# Patient Record
Sex: Male | Born: 1985 | Race: Black or African American | Hispanic: No | Marital: Single | Smoking: Current every day smoker
Health system: Southern US, Community
[De-identification: ages and names within clinical notes are randomized; demographics above are authoritative.]

## PROBLEM LIST (undated history)

## (undated) DIAGNOSIS — Z21 Asymptomatic human immunodeficiency virus [HIV] infection status: Secondary | ICD-10-CM

## (undated) DIAGNOSIS — D849 Immunodeficiency, unspecified: Secondary | ICD-10-CM

## (undated) DIAGNOSIS — B20 Human immunodeficiency virus [HIV] disease: Secondary | ICD-10-CM

---
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
## 2004-08-25 ENCOUNTER — Emergency Department (HOSPITAL_COMMUNITY): Admission: EM | Admit: 2004-08-25 | Discharge: 2004-08-26 | Payer: Self-pay | Admitting: Emergency Medicine

## 2018-05-19 ENCOUNTER — Emergency Department (HOSPITAL_COMMUNITY)
Admission: EM | Admit: 2018-05-19 | Discharge: 2018-05-19 | Disposition: A | Discharge status: Home / Self Care | Payer: Worker's Compensation | Attending: Emergency Medicine | Admitting: Emergency Medicine

## 2018-05-19 ENCOUNTER — Encounter (HOSPITAL_COMMUNITY): Payer: Self-pay | Admitting: Emergency Medicine

## 2018-05-19 DIAGNOSIS — Y990 Civilian activity done for income or pay: Secondary | ICD-10-CM | POA: Diagnosis not present

## 2018-05-19 DIAGNOSIS — Y9389 Activity, other specified: Secondary | ICD-10-CM | POA: Insufficient documentation

## 2018-05-19 DIAGNOSIS — Y9289 Other specified places as the place of occurrence of the external cause: Secondary | ICD-10-CM | POA: Insufficient documentation

## 2018-05-19 DIAGNOSIS — Z23 Encounter for immunization: Secondary | ICD-10-CM | POA: Insufficient documentation

## 2018-05-19 DIAGNOSIS — S6992XA Unspecified injury of left wrist, hand and finger(s), initial encounter: Secondary | ICD-10-CM | POA: Diagnosis present

## 2018-05-19 DIAGNOSIS — Z203 Contact with and (suspected) exposure to rabies: Secondary | ICD-10-CM | POA: Insufficient documentation

## 2018-05-19 DIAGNOSIS — S61233A Puncture wound without foreign body of left middle finger without damage to nail, initial encounter: Secondary | ICD-10-CM | POA: Diagnosis not present

## 2018-05-19 DIAGNOSIS — W540XXA Bitten by dog, initial encounter: Secondary | ICD-10-CM | POA: Diagnosis not present

## 2018-05-19 DIAGNOSIS — Z2914 Encounter for prophylactic rabies immune globin: Secondary | ICD-10-CM | POA: Diagnosis not present

## 2018-05-19 MED ORDER — AMOXICILLIN-POT CLAVULANATE 875-125 MG PO TABS
1.0000 | ORAL_TABLET | Freq: Once | ORAL | Status: AC
  Administered 2018-05-19: 1 via ORAL
  Filled 2018-05-19: qty 1

## 2018-05-19 MED ORDER — RABIES VACCINE, PCEC IM SUSR
1.0000 mL | Freq: Once | INTRAMUSCULAR | Status: AC
  Administered 2018-05-19: 1 mL via INTRAMUSCULAR
  Filled 2018-05-19: qty 1

## 2018-05-19 MED ORDER — AMOXICILLIN-POT CLAVULANATE 875-125 MG PO TABS: 1.0000 | ORAL_TABLET | Freq: Two times a day (BID) | ORAL | 0 refills | Status: AC

## 2018-05-19 MED ORDER — RABIES IMMUNE GLOBULIN 150 UNIT/ML IM INJ
20.0000 [IU]/kg | INJECTION | Freq: Once | INTRAMUSCULAR | Status: AC
  Administered 2018-05-19: 1350 [IU] via INTRAMUSCULAR
  Filled 2018-05-19 (×2): qty 9

## 2018-05-19 MED ORDER — TETANUS-DIPHTH-ACELL PERTUSSIS 5-2.5-18.5 LF-MCG/0.5 IM SUSP
0.5000 mL | Freq: Once | INTRAMUSCULAR | Status: AC
  Administered 2018-05-19: 0.5 mL via INTRAMUSCULAR
  Filled 2018-05-19: qty 0.5

## 2018-05-19 NOTE — ED Triage Notes (Addendum)
Pt bit on left middle finger by dog. Confirmed it did not have rabies vaccine- soap and water scrub. He says hurts and burns but no other symptoms.

## 2018-05-19 NOTE — ED Provider Notes (Signed)
[facility] EMERGENCY DEPARTMENT Provider Note   CSN: [id number] Arrival date & time: 05/19/18  1035     History   Chief Complaint Chief Complaint  Patient presents with  . Animal Bite    HPI Jordan Benjamin is a 32 y.o. male is here for evaluation after dog bite that occurred approximately 1 hour PTA.  Patient works at [employer] and he was picking up a dog that had just been dropped off.  Apparently dog had been found on the streets.  Dog bit him on the left hand causing 2 small puncture wounds to the left middle finger.  Initially there was moderate amount of bleeding but this stopped after pressure.  Patient used water and soap to scrub the wounds.  He has mild pain locally at wounds that worsens with palpation and movement, better with rest.  Animal control has been notified by [employer].  Patient confirms dog was unvaccinated.  [employer] is requiring full rabies immunization prior to return to work.  He is right-hand dominant.  He denies distal paresthesias or loss of sensation.  No significant past medical history.  Unknown tetanus status.  HPI  History reviewed. No pertinent past medical history.  There are no active problems to display for this patient.   History reviewed. No pertinent surgical history.      Home Medications    Prior to Admission medications   Medication Sig Start Date End Date Taking? Authorizing Provider  amoxicillin-clavulanate (AUGMENTIN) 875-125 MG tablet Take 1 tablet by mouth every 12 (twelve) hours for 7 days. 05/19/18 05/26/18  Liberty Handy, PA-C    Family History History reviewed. No pertinent family history.  Social History Social History   Tobacco Use  . Smoking status: Not on file  Substance Use Topics  . Alcohol use: Not on file  . Drug use: Not on file     Allergies   Patient has no known allergies.   Review of Systems Review of Systems  Skin: Positive for wound.  All other  systems reviewed and are negative.    Physical Exam Updated Vital Signs BP 128/81 (BP Location: Left Arm)   Pulse (!) 56   Temp 98 F (36.7 C) (Oral)   Resp 17   Wt 68 kg   SpO2 100%   Physical Exam  Constitutional: He is oriented to person, place, and time. He appears well-developed and well-nourished.  Non-toxic appearance.  HENT:  Head: Normocephalic.  Right Ear: External ear normal.  Left Ear: External ear normal.  Nose: Nose normal.  Eyes: Conjunctivae and EOM are normal.  Neck: Full passive range of motion without pain.  Cardiovascular: Normal rate.  Brisk cap refill distally to middle left finger  Pulmonary/Chest: Effort normal. No tachypnea. No respiratory distress.  Musculoskeletal: Normal range of motion.  No focal bony tenderness or crepitus to left middle finger IP joints.  Full AROM at DIP, PIP and MCP of left middle finger with mild pain.   Neurological: He is alert and oriented to person, place, and time.  Sensation to light touch intact in left distal middle finger tip  Skin: Skin is warm and dry. Capillary refill takes less than 2 seconds.  Tiny abrasion at distal phalanx near DIP joint of left middle finger, smaller abrasion over MCP as well. Minimal erythema and tenderness locally.   Psychiatric: His behavior is normal. Thought content normal.     ED Treatments / Results  Labs (all labs ordered  are listed, but only abnormal results are displayed) Labs Reviewed - No data to display  EKG None  Radiology No results found.  Procedures Procedures (including critical care time)  Medications Ordered in ED Medications  rabies immune globulin (HYPERAB/KEDRAB) injection 1,350 Units (1,350 Units Intramuscular Given 05/19/18 1308)  rabies vaccine (RABAVERT) injection 1 mL (1 mL Intramuscular Given 05/19/18 1309)  Tdap (BOOSTRIX) injection 0.5 mL (0.5 mLs Intramuscular Given 05/19/18 1252)  amoxicillin-clavulanate (AUGMENTIN) 875-125 MG per tablet 1 tablet (1  tablet Oral Given 05/19/18 1251)     Initial Impression / Assessment and Plan / ED Course  I have reviewed the triage vital signs and the nursing notes.  Pertinent labs & imaging results that were available during my care of the patient were reviewed by me and considered in my medical decision making (see chart for details).     32 year old here after dog bite from unvaccinated dog.  He has minimal superficial wounds to the left dorsal middle finger, hemostatic.  Based on exam I doubt there is FB, deeper injury or bony involvement and I do not think x-ray indicated.  Extremity neurovascularly intact.  Wound has been thoroughly cleaned.  Tdap updated.  Rabies series initiated today.  Discussed wound care instructions, patient is aware that the dog bites are high risk for infection.  He is aware of symptoms that would warrant return to the ER.  DC with wound care, NSAIDs, Augmentin.  Final Clinical Impressions(s) / ED Diagnoses   Final diagnoses:  Dog bite, initial encounter  Need for post exposure prophylaxis for rabies    ED Discharge Orders         Ordered    amoxicillin-clavulanate (AUGMENTIN) 875-125 MG tablet  Every 12 hours     05/19/18 1258           Liberty HandyGibbons, Luanne Krzyzanowski J, PA-C 05/19/18 1347    Donnetta Hutchingook, Brian, MD 05/20/18 1213

## 2018-05-19 NOTE — ED Notes (Signed)
ED Provider at bedside. 

## 2018-05-19 NOTE — ED Notes (Signed)
Pt verbalized understanding of discharge instructions and denies any further questions at this time.   

## 2018-05-19 NOTE — ED Notes (Signed)
Animal control has been previously notified prior to patients arrival. Pt has the paperwork at bedside.

## 2018-05-19 NOTE — Discharge Instructions (Addendum)
You were seen in the ER after dog bite from an unvaccinated dog.  Rabies immunization series was started today.    Day 0 05/19/18: RIB and vaccine administered Day 3 05/22/18: vaccine needed Day 7 05/26/18: vaccine needed Day 14 06/02/18: vaccine needed (last dose)   As we discussed, dog bites are very high risk for infection.  Keep your wound clean with clean water and soap, you can apply thin layer of antibiotic ointment at least twice daily.  There may be slight swelling, redness and pain and you can take ibuprofen or acetaminophen for this.  Return to the ER immediately for persistent, worsening redness, warmth, drainage, fevers or spread of symptoms up the hand or arm.  Take augmentin (antibiotic) as prescribed and until completed

## 2018-05-23 ENCOUNTER — Other Ambulatory Visit: Payer: Self-pay

## 2018-05-23 ENCOUNTER — Encounter (HOSPITAL_COMMUNITY): Payer: Self-pay

## 2018-05-23 ENCOUNTER — Emergency Department (HOSPITAL_COMMUNITY)
Admission: EM | Admit: 2018-05-23 | Discharge: 2018-05-23 | Disposition: A | Discharge status: Home / Self Care | Payer: Worker's Compensation | Attending: Emergency Medicine | Admitting: Emergency Medicine

## 2018-05-23 DIAGNOSIS — J069 Acute upper respiratory infection, unspecified: Secondary | ICD-10-CM | POA: Insufficient documentation

## 2018-05-23 DIAGNOSIS — F17210 Nicotine dependence, cigarettes, uncomplicated: Secondary | ICD-10-CM | POA: Insufficient documentation

## 2018-05-23 DIAGNOSIS — Z2914 Encounter for prophylactic rabies immune globin: Secondary | ICD-10-CM | POA: Diagnosis not present

## 2018-05-23 DIAGNOSIS — Z23 Encounter for immunization: Secondary | ICD-10-CM

## 2018-05-23 MED ORDER — FLUTICASONE PROPIONATE 50 MCG/ACT NA SUSP: 1.0000 | Freq: Every day | NASAL | 0 refills | Status: DC

## 2018-05-23 MED ORDER — BENZONATATE 100 MG PO CAPS: 100.0000 mg | ORAL_CAPSULE | Freq: Three times a day (TID) | ORAL | 0 refills | Status: DC

## 2018-05-23 MED ORDER — RABIES VACCINE, PCEC IM SUSR
1.0000 mL | Freq: Once | INTRAMUSCULAR | Status: AC
  Administered 2018-05-23: 1 mL via INTRAMUSCULAR
  Filled 2018-05-23: qty 1

## 2018-05-23 MED ORDER — CETIRIZINE-PSEUDOEPHEDRINE ER 5-120 MG PO TB12: 1.0000 | ORAL_TABLET | Freq: Every day | ORAL | 0 refills | Status: DC

## 2018-05-23 NOTE — Discharge Instructions (Addendum)
Take Tessalon every 8 hours as needed for cough.  Take Flonase once daily as prescribed for nasal congestion.  Take Zyrtec-D once daily as well for nasal congestion.  Please go to urgent care across the street as scheduled for your next rabies vaccination according to your schedule given to you at your last visit.  Please return to emergency department if you develop any new or worsening symptoms including shortness of breath, ongoing productive cough in conjunction with persistent fever over 100.4, or any other concerning symptoms.

## 2018-05-23 NOTE — ED Provider Notes (Signed)
[facility] EMERGENCY DEPARTMENT Provider Note   CSN: [id number] Arrival date & time: 05/23/18  1648     History   Chief Complaint Chief Complaint  Patient presents with  . Rabies Injection    HPI Jordan Benjamin is a 32 y.o. male who presents for next rabies injection in series after dog bite on 05/19/2018.  Patient reports over the past few days he is also had nasal congestion and sore throat.  He reports having a fever of 100 today.  He reports he just started coughing intermittently today.  He denies any chest pain, shortness of breath, abdominal pain, nausea, vomiting, although he did vomit twice after his 7 shots the day he was back.  He has not had any further vomiting.  His wound has been healing well and denies any pain in the area.  He reports he has been around 2 people at work with similar URI symptoms.  HPI  History reviewed. No pertinent past medical history.  There are no active problems to display for this patient.   History reviewed. No pertinent surgical history.      Home Medications    Prior to Admission medications   Medication Sig Start Date End Date Taking? Authorizing Provider  amoxicillin-clavulanate (AUGMENTIN) 875-125 MG tablet Take 1 tablet by mouth every 12 (twelve) hours for 7 days. 05/19/18 05/26/18  Liberty Handy, PA-C  benzonatate (TESSALON) 100 MG capsule Take 1 capsule (100 mg total) by mouth every 8 (eight) hours. 05/23/18   Seferina Brokaw, Waylan Boga, PA-C  cetirizine-pseudoephedrine (ZYRTEC-D) 5-120 MG tablet Take 1 tablet by mouth daily. 05/23/18   Nancy Manuele, Waylan Boga, PA-C  fluticasone (FLONASE) 50 MCG/ACT nasal spray Place 1 spray into both nostrils daily. 05/23/18   Emi Holes, PA-C    Family History History reviewed. No pertinent family history.  Social History Social History   Tobacco Use  . Smoking status: Current Every Day Smoker  . Smokeless tobacco: Never Used  Substance Use Topics  . Alcohol use: Not Currently   . Drug use: Never     Allergies   Patient has no known allergies.   Review of Systems Review of Systems  Constitutional: Positive for fever.  HENT: Positive for congestion and ear pain.   Respiratory: Positive for cough. Negative for shortness of breath.   Cardiovascular: Negative for chest pain.  Skin: Positive for wound.     Physical Exam Updated Vital Signs BP 138/87 (BP Location: Right Arm)   Pulse 84   Temp 99 F (37.2 C) (Oral)   Resp 18   Wt 68 kg   SpO2 100%   Physical Exam  Constitutional: He appears well-developed and well-nourished. No distress.  HENT:  Head: Normocephalic and atraumatic.  Right Ear: Tympanic membrane normal.  Left Ear: Tympanic membrane normal.  Mouth/Throat: Posterior oropharyngeal erythema present. No oropharyngeal exudate, posterior oropharyngeal edema or tonsillar abscesses. Tonsils are 0 on the right. Tonsils are 0 on the left. No tonsillar exudate.  Eyes: Pupils are equal, round, and reactive to light. Conjunctivae are normal. Right eye exhibits no discharge. Left eye exhibits no discharge. No scleral icterus.  Neck: Normal range of motion. Neck supple. No thyromegaly present.  Cardiovascular: Normal rate, regular rhythm, normal heart sounds and intact distal pulses. Exam reveals no gallop and no friction rub.  No murmur heard. Pulmonary/Chest: Effort normal and breath sounds normal. No stridor. No respiratory distress. He has no wheezes. He has no rales.  Abdominal: Soft. Bowel sounds  are normal. He exhibits no distension. There is no tenderness. There is no rebound and no guarding.  Musculoskeletal: He exhibits no edema.  Lymphadenopathy:    He has no cervical adenopathy.  Neurological: He is alert. Coordination normal.  Skin: Skin is warm and dry. No rash noted. He is not diaphoretic. No pallor.  Well-healing, barely visible, puncture wounds to the left long finger  Psychiatric: He has a normal mood and affect.  Nursing note and  vitals reviewed.    ED Treatments / Results  Labs (all labs ordered are listed, but only abnormal results are displayed) Labs Reviewed - No data to display  EKG None  Radiology No results found.  Procedures Procedures (including critical care time)  Medications Ordered in ED Medications  rabies vaccine (RABAVERT) injection 1 mL (1 mL Intramuscular Given 05/23/18 1914)     Initial Impression / Assessment and Plan / ED Course  I have reviewed the triage vital signs and the nursing notes.  Pertinent labs & imaging results that were available during my care of the patient were reviewed by me and considered in my medical decision making (see chart for details).     Patient presenting for rabies vaccination, second in the series.  Vaccination given.  Patient also with URI symptoms.  Lungs are clear patient reports very mild cough but only began today.  Patient is afebrile today.  We will treat supportively with Tessalon, Zyrtec-D, Flonase.  Strict return precautions given.  Patient advised to follow-up at urgent care for next rabies vaccination.  Patient understands and agrees with plan.  Patient vitals stable throughout ED course and discharged in satisfactory condition.  Final Clinical Impressions(s) / ED Diagnoses   Final diagnoses:  Need for rabies vaccination  Upper respiratory tract infection, unspecified type    ED Discharge Orders         Ordered    benzonatate (TESSALON) 100 MG capsule  Every 8 hours     05/23/18 1919    cetirizine-pseudoephedrine (ZYRTEC-D) 5-120 MG tablet  Daily     05/23/18 1919    fluticasone (FLONASE) 50 MCG/ACT nasal spray  Daily     05/23/18 1919           Emi HolesLaw, Sapphire Tygart M, PA-C 05/23/18 1920    Virgina Norfolkuratolo, Adam, DO 05/24/18 0115

## 2018-05-23 NOTE — ED Triage Notes (Signed)
Pt here for second round of rabies injections, first round done on 05/19/18. A&Ox4 no signs of infections on the wound.

## 2018-05-23 NOTE — ED Notes (Signed)
Pt here to receive he next dose of rabies vaccination.

## 2019-08-15 DIAGNOSIS — Z20828 Contact with and (suspected) exposure to other viral communicable diseases: Secondary | ICD-10-CM | POA: Diagnosis not present

## 2019-09-15 ENCOUNTER — Ambulatory Visit (INDEPENDENT_AMBULATORY_CARE_PROVIDER_SITE_OTHER): Payer: BC Managed Care – PPO

## 2019-09-15 ENCOUNTER — Ambulatory Visit (HOSPITAL_COMMUNITY)
Admission: EM | Admit: 2019-09-15 | Discharge: 2019-09-15 | Disposition: A | Discharge status: Home / Self Care | Payer: BC Managed Care – PPO | Attending: Urgent Care | Admitting: Urgent Care

## 2019-09-15 ENCOUNTER — Other Ambulatory Visit: Payer: Self-pay

## 2019-09-15 ENCOUNTER — Encounter (HOSPITAL_COMMUNITY): Payer: Self-pay

## 2019-09-15 DIAGNOSIS — F17200 Nicotine dependence, unspecified, uncomplicated: Secondary | ICD-10-CM | POA: Diagnosis not present

## 2019-09-15 DIAGNOSIS — K529 Noninfective gastroenteritis and colitis, unspecified: Secondary | ICD-10-CM | POA: Insufficient documentation

## 2019-09-15 DIAGNOSIS — R109 Unspecified abdominal pain: Secondary | ICD-10-CM | POA: Diagnosis not present

## 2019-09-15 DIAGNOSIS — Z20822 Contact with and (suspected) exposure to covid-19: Secondary | ICD-10-CM | POA: Insufficient documentation

## 2019-09-15 DIAGNOSIS — Z79899 Other long term (current) drug therapy: Secondary | ICD-10-CM | POA: Diagnosis not present

## 2019-09-15 MED ORDER — ONDANSETRON 4 MG PO TBDP: 4.0000 mg | ORAL_TABLET | Freq: Three times a day (TID) | ORAL | 0 refills | Status: DC | PRN

## 2019-09-15 NOTE — ED Triage Notes (Signed)
Pt present to UC with diarrhea, vomiting and headache x 1 day.

## 2019-09-15 NOTE — Discharge Instructions (Addendum)
Nothing concerning on x-ray today.  This is mostly likely some sort of GI illness. I am sending in Zofran to use as needed for nausea, vomiting.  Make sure you are trying to sip fluids and stay hydrated.  Small bland meals for now.  Advance diet as tolerated.  If your symptoms continue or worsen you will need to follow-up

## 2019-09-17 LAB — NOVEL CORONAVIRUS, NAA (HOSP ORDER, SEND-OUT TO REF LAB; TAT 18-24 HRS): SARS-CoV-2, NAA: NOT DETECTED

## 2019-09-18 NOTE — ED Provider Notes (Signed)
[facility]    CSN: [id number] Arrival date & time: 09/15/19  1014      History   Chief Complaint Chief Complaint  Patient presents with  . Emesis  . Diarrhea    HPI Jordan Benjamin is a 34 y.o. male.   Patient is a 34 year old male who presents today with generalized abdominal discomfort, diarrhea, vomiting headache x1 day.  Vomited 4-5 times.  No blood in emesis.  Mostly vomiting after trying to eat.  Has been sipping fluids.  Symptoms have been constant.  He is also had mild cough, nasal congestion and rhinorrhea.  No fever, chills, body aches or night sweats.  Has has had some mild constipation.  No rectal bleeding or rectal pain.   ROS per HPI    Emesis Associated symptoms: diarrhea   Diarrhea Associated symptoms: vomiting     History reviewed. No pertinent past medical history.  There are no problems to display for this patient.   History reviewed. No pertinent surgical history.     Home Medications    Prior to Admission medications   Medication Sig Start Date End Date Taking? Authorizing Provider  benzonatate (TESSALON) 100 MG capsule Take 1 capsule (100 mg total) by mouth every 8 (eight) hours. 05/23/18   Law, Bea Graff, PA-C  cetirizine-pseudoephedrine (ZYRTEC-D) 5-120 MG tablet Take 1 tablet by mouth daily. 05/23/18   Law, Bea Graff, PA-C  fluticasone (FLONASE) 50 MCG/ACT nasal spray Place 1 spray into both nostrils daily. 05/23/18   Law, Bea Graff, PA-C  ondansetron (ZOFRAN ODT) 4 MG disintegrating tablet Take 1 tablet (4 mg total) by mouth every 8 (eight) hours as needed for nausea or vomiting. 09/15/19   Orvan July, NP    Family History History reviewed. No pertinent family history.  Social History Social History   Tobacco Use  . Smoking status: Current Every Day Smoker  . Smokeless tobacco: Never Used  Substance Use Topics  . Alcohol use: Not Currently  . Drug use: Never     Allergies   Patient has no known  allergies.   Review of Systems Review of Systems  Gastrointestinal: Positive for diarrhea and vomiting.     Physical Exam Triage Vital Signs ED Triage Vitals  Enc Vitals Group     BP 09/15/19 1107 136/80     Pulse Rate 09/15/19 1107 78     Resp 09/15/19 1107 16     Temp 09/15/19 1107 98.8 F (37.1 C)     Temp Source 09/15/19 1107 Oral     SpO2 09/15/19 1107 100 %     Weight --      Height --      Head Circumference --      Peak Flow --      Pain Score 09/15/19 1105 2     Pain Loc --      Pain Edu? --      Excl. in [place]? --    No data found.  Updated Vital Signs BP 136/80 (BP Location: Left Arm)   Pulse 78   Temp 98.8 F (37.1 C) (Oral)   Resp 16   SpO2 100%   Visual Acuity Right Eye Distance:   Left Eye Distance:   Bilateral Distance:    Right Eye Near:   Left Eye Near:    Bilateral Near:     Physical Exam Vitals and nursing note reviewed.  Constitutional:      General: He is not in acute distress.  Appearance: Normal appearance. He is not ill-appearing, toxic-appearing or diaphoretic.  HENT:     Head: Normocephalic and atraumatic.     Nose: Nose normal.     Mouth/Throat:     Pharynx: Oropharynx is clear.  Eyes:     Conjunctiva/sclera: Conjunctivae normal.  Cardiovascular:     Rate and Rhythm: Normal rate and regular rhythm.  Pulmonary:     Effort: Pulmonary effort is normal.     Breath sounds: Normal breath sounds.  Abdominal:     Palpations: Abdomen is soft.     Tenderness: There is generalized abdominal tenderness. There is no guarding or rebound.  Musculoskeletal:        General: Normal range of motion.     Cervical back: Normal range of motion.  Skin:    General: Skin is warm and dry.  Neurological:     Mental Status: He is alert.  Psychiatric:        Mood and Affect: Mood normal.      UC Treatments / Results  Labs (all labs ordered are listed, but only abnormal results are displayed) Labs Reviewed  NOVEL CORONAVIRUS, NAA (HOSP  ORDER, SEND-OUT TO REF LAB; TAT 18-24 HRS)    EKG   Radiology No results found.  Procedures Procedures (including critical care time)  Medications Ordered in UC Medications - No data to display  Initial Impression / Assessment and Plan / UC Course  I have reviewed the triage vital signs and the nursing notes.  Pertinent labs & imaging results that were available during my care of the patient were reviewed by me and considered in my medical decision making (see chart for details).     Gastroenteritis-symptoms most consistent with this. X-ray without any concern for bowel obstruction.  Patient is not constipated. No acute abdomen on exam.  Sending Zofran to use as needed for nausea, vomiting Recommended sip fluids to stay hydrated. Small bland meals for now advance diet as tolerated. Follow up as needed for continued or worsening symptoms Covid test pending.   Final Clinical Impressions(s) / UC Diagnoses   Final diagnoses:  Gastroenteritis     Discharge Instructions     Nothing concerning on x-ray today.  This is mostly likely some sort of GI illness. I am sending in Zofran to use as needed for nausea, vomiting.  Make sure you are trying to sip fluids and stay hydrated.  Small bland meals for now.  Advance diet as tolerated.  If your symptoms continue or worsen you will need to follow-up    ED Prescriptions    Medication Sig Dispense Auth. Provider   ondansetron (ZOFRAN ODT) 4 MG disintegrating tablet Take 1 tablet (4 mg total) by mouth every 8 (eight) hours as needed for nausea or vomiting. 20 tablet Dahlia Byes A, NP     PDMP not reviewed this encounter.   Janace Aris, NP 09/18/19 [phone]

## 2019-09-25 ENCOUNTER — Ambulatory Visit: Payer: BC Managed Care – PPO | Attending: Internal Medicine

## 2019-09-25 DIAGNOSIS — Z20822 Contact with and (suspected) exposure to covid-19: Secondary | ICD-10-CM | POA: Diagnosis not present

## 2019-09-26 LAB — NOVEL CORONAVIRUS, NAA: SARS-CoV-2, NAA: NOT DETECTED

## 2019-09-29 DIAGNOSIS — Z20828 Contact with and (suspected) exposure to other viral communicable diseases: Secondary | ICD-10-CM | POA: Diagnosis not present

## 2020-04-16 DIAGNOSIS — Z20822 Contact with and (suspected) exposure to covid-19: Secondary | ICD-10-CM | POA: Diagnosis not present

## 2020-04-26 ENCOUNTER — Ambulatory Visit: Payer: BC Managed Care – PPO

## 2020-04-26 ENCOUNTER — Other Ambulatory Visit (HOSPITAL_COMMUNITY)
Admission: RE | Admit: 2020-04-26 | Discharge: 2020-04-26 | Disposition: A | Discharge status: Home / Self Care | Payer: BC Managed Care – PPO | Source: Ambulatory Visit | Attending: Infectious Diseases | Admitting: Infectious Diseases

## 2020-04-26 ENCOUNTER — Other Ambulatory Visit: Payer: Self-pay

## 2020-04-26 ENCOUNTER — Ambulatory Visit: Payer: BC Managed Care – PPO | Admitting: *Deleted

## 2020-04-26 ENCOUNTER — Other Ambulatory Visit: Payer: BC Managed Care – PPO

## 2020-04-26 ENCOUNTER — Other Ambulatory Visit: Payer: Self-pay | Admitting: *Deleted

## 2020-04-26 DIAGNOSIS — B20 Human immunodeficiency virus [HIV] disease: Secondary | ICD-10-CM | POA: Diagnosis not present

## 2020-04-26 DIAGNOSIS — Z79899 Other long term (current) drug therapy: Secondary | ICD-10-CM | POA: Diagnosis not present

## 2020-04-26 DIAGNOSIS — Z113 Encounter for screening for infections with a predominantly sexual mode of transmission: Secondary | ICD-10-CM | POA: Insufficient documentation

## 2020-04-26 LAB — T-HELPER CELL (CD4) - (RCID CLINIC ONLY)
CD4 % Helper T Cell: 6 % — ABNORMAL LOW (ref 33–65)
CD4 T Cell Abs: 101 /uL — ABNORMAL LOW (ref 400–1790)

## 2020-04-26 MED ORDER — BIKTARVY 50-200-25 MG PO TABS: 1.0000 | ORAL_TABLET | Freq: Every day | ORAL | 0 refills | Status: DC

## 2020-04-26 NOTE — Progress Notes (Signed)
Met with patient for modified intake. 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 Patient came in for financial counseling, asked to meet with a nurse for questions. RN introduced him to the clinic, answered his questions to his satisfaction.  He is eager to start medication today.  He has no known allergies, takes no medication other than the occasional advil for current shoulder pain (hit his right shoulder on the wall).  RN obtained verbal order for Biktarvy #30 to be sent to his local [facility] pharmacy, gave patient copay card to cover his out of pocket expenses.  RN explained how to take Biktarvy and potential side effects.  Patient will contact [facility] if he has any nausea or other side effect. He will be going out of town in September, but will be back for his new patient appointment with Dr West Bali 9/16.  He asked for assistance in reactivating his MyChart. RN reset his password. He will call if he has any questions or issues. Patient's questions answered to his satisfaction. Landis Gandy, RN

## 2020-04-29 LAB — URINE CYTOLOGY ANCILLARY ONLY
Chlamydia: NEGATIVE
Comment: NEGATIVE
Comment: NORMAL
Neisseria Gonorrhea: NEGATIVE

## 2020-05-07 ENCOUNTER — Other Ambulatory Visit: Payer: Self-pay | Admitting: *Deleted

## 2020-05-07 DIAGNOSIS — B20 Human immunodeficiency virus [HIV] disease: Secondary | ICD-10-CM

## 2020-05-07 MED ORDER — BIKTARVY 50-200-25 MG PO TABS: 1.0000 | ORAL_TABLET | Freq: Every day | ORAL | 0 refills | Status: DC

## 2020-05-11 LAB — COMPLETE METABOLIC PANEL WITH GFR
AG Ratio: 1.2 (calc) (ref 1.0–2.5)
ALT: 20 U/L (ref 9–46)
AST: 22 U/L (ref 10–40)
Albumin: 4.7 g/dL (ref 3.6–5.1)
Alkaline phosphatase (APISO): 70 U/L (ref 36–130)
BUN: 12 mg/dL (ref 7–25)
CO2: 29 mmol/L (ref 20–32)
Calcium: 10 mg/dL (ref 8.6–10.3)
Chloride: 103 mmol/L (ref 98–110)
Creat: 1.13 mg/dL (ref 0.60–1.35)
GFR, Est African American: 97 mL/min/{1.73_m2} (ref >=60)
GFR, Est Non African American: 84 mL/min/{1.73_m2} (ref >=60)
Globulin: 3.8 g/dL (calc) — ABNORMAL HIGH (ref 1.9–3.7)
Glucose, Bld: 66 mg/dL (ref 65–99)
Potassium: 4.5 mmol/L (ref 3.5–5.3)
Sodium: 138 mmol/L (ref 135–146)
Total Bilirubin: 0.4 mg/dL (ref 0.2–1.2)
Total Protein: 8.5 g/dL — ABNORMAL HIGH (ref 6.1–8.1)

## 2020-05-11 LAB — HIV-1/2 AB - DIFFERENTIATION
HIV-1 antibody: POSITIVE — AB
HIV-2 Ab: UNDETERMINED — AB

## 2020-05-11 LAB — QUANTIFERON-TB GOLD PLUS
Mitogen-NIL: 6.94 IU/mL
NIL: 0.07 IU/mL
QuantiFERON-TB Gold Plus: NEGATIVE
TB1-NIL: 0.01 IU/mL
TB2-NIL: 0 IU/mL

## 2020-05-11 LAB — HEPATITIS B SURFACE ANTIGEN: Hepatitis B Surface Ag: NONREACTIVE

## 2020-05-11 LAB — HLA B*5701: HLA-B*5701 w/rflx HLA-B High: NEGATIVE

## 2020-05-11 LAB — URINALYSIS
Bilirubin Urine: NEGATIVE
Glucose, UA: NEGATIVE
Hgb urine dipstick: NEGATIVE
Ketones, ur: NEGATIVE
Leukocytes,Ua: NEGATIVE
Nitrite: NEGATIVE
Protein, ur: NEGATIVE
Specific Gravity, Urine: 1.018 (ref 1.001–1.03)
pH: 6 (ref 5.0–8.0)

## 2020-05-11 LAB — CBC WITH DIFFERENTIAL/PLATELET
Absolute Monocytes: 293 cells/uL (ref 200–950)
Basophils Absolute: 39 cells/uL (ref 0–200)
Basophils Relative: 1 %
Eosinophils Absolute: 90 cells/uL (ref 15–500)
Eosinophils Relative: 2.3 %
HCT: 43.8 % (ref 38.5–50.0)
Hemoglobin: 14.4 g/dL (ref 13.2–17.1)
Lymphs Abs: 1849 cells/uL (ref 850–3900)
MCH: 28 pg (ref 27.0–33.0)
MCHC: 32.9 g/dL (ref 32.0–36.0)
MCV: 85.2 fL (ref 80.0–100.0)
MPV: 10.3 fL (ref 7.5–12.5)
Monocytes Relative: 7.5 %
Neutro Abs: 1630 cells/uL (ref 1500–7800)
Neutrophils Relative %: 41.8 %
Platelets: 212 10*3/uL (ref 140–400)
RBC: 5.14 10*6/uL (ref 4.20–5.80)
RDW: 12.9 % (ref 11.0–15.0)
Total Lymphocyte: 47.4 %
WBC: 3.9 10*3/uL (ref 3.8–10.8)

## 2020-05-11 LAB — LIPID PANEL
Cholesterol: 113 mg/dL (ref <200)
HDL: 48 mg/dL (ref >=40)
LDL Cholesterol (Calc): 42 mg/dL (calc)
Non-HDL Cholesterol (Calc): 65 mg/dL (calc) (ref <130)
Total CHOL/HDL Ratio: 2.4 (calc) (ref <5.0)
Triglycerides: 142 mg/dL (ref <150)

## 2020-05-11 LAB — RPR: RPR Ser Ql: NONREACTIVE

## 2020-05-11 LAB — HIV-1 RNA ULTRAQUANT REFLEX TO GENTYP+
HIV 1 RNA Quant: 318000 copies/mL — ABNORMAL HIGH
HIV-1 RNA Quant, Log: 5.5 Log copies/mL — ABNORMAL HIGH

## 2020-05-11 LAB — HEPATITIS C ANTIBODY
Hepatitis C Ab: NONREACTIVE
SIGNAL TO CUT-OFF: 0.14 (ref <1.00)

## 2020-05-11 LAB — HEPATITIS A ANTIBODY, TOTAL: Hepatitis A AB,Total: NONREACTIVE

## 2020-05-11 LAB — HIV ANTIBODY (ROUTINE TESTING W REFLEX): HIV 1&2 Ab, 4th Generation: REACTIVE — AB

## 2020-05-11 LAB — HEPATITIS B SURFACE ANTIBODY,QUALITATIVE: Hep B S Ab: REACTIVE — AB

## 2020-05-11 LAB — HEPATITIS B CORE ANTIBODY, TOTAL: Hep B Core Total Ab: NONREACTIVE

## 2020-05-11 LAB — HIV-1 GENOTYPE: HIV-1 Genotype: DETECTED — AB

## 2020-05-16 ENCOUNTER — Encounter: Payer: Self-pay | Admitting: Infectious Diseases

## 2020-05-16 ENCOUNTER — Ambulatory Visit (INDEPENDENT_AMBULATORY_CARE_PROVIDER_SITE_OTHER): Payer: BC Managed Care – PPO | Admitting: Pharmacist

## 2020-05-16 ENCOUNTER — Other Ambulatory Visit (HOSPITAL_COMMUNITY)
Admission: RE | Admit: 2020-05-16 | Discharge: 2020-05-16 | Disposition: A | Discharge status: Home / Self Care | Payer: BC Managed Care – PPO | Source: Ambulatory Visit | Attending: Infectious Diseases | Admitting: Infectious Diseases

## 2020-05-16 ENCOUNTER — Other Ambulatory Visit: Payer: Self-pay

## 2020-05-16 ENCOUNTER — Ambulatory Visit (INDEPENDENT_AMBULATORY_CARE_PROVIDER_SITE_OTHER): Payer: BC Managed Care – PPO | Admitting: Infectious Diseases

## 2020-05-16 VITALS — BP 137/84 | HR 66 | Temp 98.6°F | Wt 152.0 lb

## 2020-05-16 DIAGNOSIS — B20 Human immunodeficiency virus [HIV] disease: Secondary | ICD-10-CM | POA: Diagnosis not present

## 2020-05-16 MED ORDER — BICTEGRAVIR-EMTRICITAB-TENOFOV 50-200-25 MG PO TABS: 1.0000 | ORAL_TABLET | Freq: Every day | ORAL | 3 refills | Status: DC

## 2020-05-16 MED ORDER — SULFAMETHOXAZOLE-TRIMETHOPRIM 400-80 MG PO TABS: 1.0000 | ORAL_TABLET | Freq: Every day | ORAL | 3 refills | Status: DC

## 2020-05-16 NOTE — Progress Notes (Signed)
HPI: Bliss Tsang is a 34 y.o. male who presents to the [facility] clinic today to initiate care for a newly diagnosed HIV infection.  There are no problems to display for this patient.   Patient's Medications  New Prescriptions   No medications on file  Previous Medications   BICTEGRAVIR-EMTRICITABINE-TENOFOVIR AF (BIKTARVY) 50-200-25 MG TABS TABLET    Take 1 tablet by mouth daily.   BICTEGRAVIR-EMTRICITABINE-TENOFOVIR AF (BIKTARVY) 50-200-25 MG TABS TABLET    Take 1 tablet by mouth daily.   CETIRIZINE-PSEUDOEPHEDRINE (ZYRTEC-D) 5-120 MG TABLET    Take 1 tablet by mouth daily.   SULFAMETHOXAZOLE-TRIMETHOPRIM (BACTRIM) 400-80 MG TABLET    Take 1 tablet by mouth daily.  Modified Medications   No medications on file  Discontinued Medications   No medications on file    Allergies: No Known Allergies  Past Medical History: No past medical history on file.  Social History: Social History   Socioeconomic History  . Marital status: Single    Spouse name: Not on file  . Number of children: Not on file  . Years of education: Not on file  . Highest education level: Not on file  Occupational History  . Not on file  Tobacco Use  . Smoking status: Current Every Day Smoker  . Smokeless tobacco: Never Used  . Tobacco comment: 1-2 cigarettes a day   Substance and Sexual Activity  . Alcohol use: Yes    Comment: very rarely   . Drug use: Yes    Types: Marijuana    Comment: daily   . Sexual activity: Yes    Birth control/protection: Condom  Other Topics Concern  . Not on file  Social History Narrative  . Not on file   Social Determinants of Health   Financial Resource Strain:   . Difficulty of Paying Living Expenses: Not on file  Food Insecurity:   . Worried About Programme researcher, broadcasting/film/video in the Last Year: Not on file  . Ran Out of Food in the Last Year: Not on file  Transportation Needs:   . Lack of Transportation (Medical): Not on file  . Lack of Transportation (Non-Medical):  Not on file  Physical Activity:   . Days of Exercise per Week: Not on file  . Minutes of Exercise per Session: Not on file  Stress:   . Feeling of Stress : Not on file  Social Connections:   . Frequency of Communication with Friends and Family: Not on file  . Frequency of Social Gatherings with Friends and Family: Not on file  . Attends Religious Services: Not on file  . Active Member of Clubs or Organizations: Not on file  . Attends Banker Meetings: Not on file  . Marital Status: Not on file    Labs: Lab Results  Component Value Date   HIV1RNAQUANT 318,000 (H) 04/26/2020   CD4TABS 101 (L) 04/26/2020    RPR and STI Lab Results  Component Value Date   LABRPR NON-REACTIVE 04/26/2020    STI Results GC CT  04/26/2020 Negative Negative    Hepatitis B Lab Results  Component Value Date   HEPBSAB REACTIVE (A) 04/26/2020   HEPBSAG NON-REACTIVE 04/26/2020   HEPBCAB NON-REACTIVE 04/26/2020   Hepatitis C Lab Results  Component Value Date   HEPCAB NON-REACTIVE 04/26/2020   Hepatitis A Lab Results  Component Value Date   HAV NON-REACTIVE 04/26/2020   Lipids: Lab Results  Component Value Date   CHOL 113 04/26/2020   TRIG 142 04/26/2020  HDL 48 04/26/2020   CHOLHDL 2.4 04/26/2020   LDLCALC 42 04/26/2020    Current HIV Regimen: Treatment naive  Assessment: Patient has been taking Dovato for 2 weeks and will start Biktarvy today.   Explained that Susanne Borders is a one pill once daily medication with or without food and the importance of not missing any doses. Explained resistance and how it develops and why it is so important to take Biktarvy daily and not skip days or doses. Counseled patient to take it around the same time each day. Counseled on what to do if dose is missed, if closer to missed dose take immediately, if closer to next dose then skip and resume normal schedule.   Cautioned on possible side effects the first week or so including nausea,  diarrhea, dizziness, and headaches but that they should resolve after the first couple of weeks. I reviewed patient medications and found no drug interactions. Counseled patient to separate Biktarvy from divalent cations including multivitamins. Discussed with patient to call clinic if he starts a new medication or herbal supplement. I gave the patient my card and told him to call me with any issues/questions/concerns.  Plan: - Start Biktarvy daily  Jaylenn Baiza L. Tiah Heckel, PharmD, BCIDP, AAHIVP, CPP Clinical Pharmacist Practitioner Infectious Diseases Clinical Pharmacist [facility] 05/16/2020, 4:33 PM

## 2020-05-16 NOTE — Progress Notes (Signed)
[facility], [place], [place], [address]                                                                  Phn. [phone]; Fax: [phone]                                                                             Date: 05/16/20  Reason for Visit: HIV Initial Visit Primary Care provider: No PCP   HPI: Jordan Benjamin is a 34 y.o.old male with a recent diagnosis of HIV who is here for the first provider visit to establish care. Patient is a MSM and has been sexually active with males only lately, however was sexually active with male while in school. He was tested for HIV when he went to get the [place] vaccine in April 15, 2020. He says he has not been sexually active for few months now. He has started taking Dovato for 2 weeks and has started taking Biktarvy from today. He has been compliant with the ART. He lives by himself in [facility] and works in the [employer]. Overall he feels well and denies having any comoplaints today.  He smokes 1-2 cigarettes everyday and drinks alcohol socially and smokes marijuana everyday.   ROS: Denies dysphagia, odynophagia, cough, fever, nausea, vomiting, diarrhea, constipation, weight loss, chills, night sweats, recent hospitalizations, rashes, joint complaints, shortness of breath, headaches, chest pain, abdominal pain, dysuri              Current Outpatient Medications on File Prior to Visit  Medication Sig Dispense Refill  . bictegravir-emtricitabine-tenofovir AF (BIKTARVY) 50-200-25 MG TABS tablet Take 1 tablet by mouth daily. 30 tablet 0  . cetirizine-pseudoephedrine (ZYRTEC-D) 5-120 MG tablet Take 1 tablet by mouth daily. 30 tablet 0  . benzonatate (TESSALON) 100 MG capsule Take 1 capsule (100 mg total) by mouth every 8 (eight)  hours. 21 capsule 0  . fluticasone (FLONASE) 50 MCG/ACT nasal spray Place 1 spray into both nostrils daily. 5 g 0  . ondansetron (ZOFRAN ODT) 4 MG disintegrating tablet Take 1 tablet (4 mg total) by mouth every 8 (eight) hours as needed for nausea or vomiting. 20 tablet 0   No current facility-administered medications on file prior to visit.                            No Known Allergies  PMH: None  Social History   Socioeconomic History  . Marital status: Single    Spouse name: Not on file  . Number of children: Not on file  .  Years of education: Not on file  . Highest education level: Not on file  Occupational History  . Not on file  Tobacco Use  . Smoking status: Current Every Day Smoker  . Smokeless tobacco: Never Used  . Tobacco comment: 1-2 cigarettes a day   Substance and Sexual Activity  . Alcohol use: Yes    Comment: very rarely   . Drug use: Yes    Types: Marijuana    Comment: daily   . Sexual activity: Yes    Birth control/protection: Condom  Other Topics Concern  . Not on file  Social History Narrative  . Not on file   Social Determinants of Health   Financial Resource Strain:   . Difficulty of Paying Living Expenses: Not on file  Food Insecurity:   . Worried About Charity fundraiser in the Last Year: Not on file  . Ran Out of Food in the Last Year: Not on file  Transportation Needs:   . Lack of Transportation (Medical): Not on file  . Lack of Transportation (Non-Medical): Not on file  Physical Activity:   . Days of Exercise per Week: Not on file  . Minutes of Exercise per Session: Not on file  Stress:   . Feeling of Stress : Not on file  Social Connections:   . Frequency of Communication with Friends and Family: Not on file  . Frequency of Social Gatherings with Friends and Family: Not on file  . Attends Religious Services: Not on file  . Active Member of Clubs or Organizations: Not on file  . Attends Archivist Meetings: Not on file    . Marital Status: Not on file  Intimate Partner Violence:   . Fear of Current or Ex-Partner: Not on file  . Emotionally Abused: Not on file  . Physically Abused: Not on file  . Sexually Abused: Not on file    VITALs  BP 137/84   Pulse 66   Temp 98.6 F (37 C) (Oral)   Wt 152 lb (68.9 kg)   SpO2 99%    Examination  Gen: Alert and oriented x 3, no acute distress HEENT: [place]/AT, PERL, no scleral icterus, no pale conjunctivae, hearing normal, oral mucosa moist, NO THRUSH  Neck: Supple, no lymphadenopathy Cardio: Regular rate and rhythm; +S1 and S2; no murmurs, gallops, or rubs Resp: CTAB; no wheezes, rhonchi, or rales GI: Soft, nontender, nondistended, bowel sounds present Extremities: No cyanosis, clubbing, or edema; +2 PT and DP pulses Skin: No rashes, lesions, or ecchymoses Neuro: No focal deficits; CNs II-XII intact; sensation intact; +5/5 MMS b/l UEs and LEs Psych: Calm, cooperative  Lab Results HIV 1 RNA Quant (copies/mL)  Date Value  04/26/2020 318,000 (H)   CD4 T Cell Abs (/uL)  Date Value  04/26/2020 101 (L)   No results found for: HIV1GENOSEQ Lab Results  Component Value Date   WBC 3.9 04/26/2020   HGB 14.4 04/26/2020   HCT 43.8 04/26/2020   MCV 85.2 04/26/2020   PLT 212 04/26/2020    Lab Results  Component Value Date   CREATININE 1.13 04/26/2020   BUN 12 04/26/2020   NA 138 04/26/2020   K 4.5 04/26/2020   CL 103 04/26/2020   CO2 29 04/26/2020   Lab Results  Component Value Date   ALT 20 04/26/2020   AST 22 04/26/2020   BILITOT 0.4 04/26/2020    Lab Results  Component Value Date   CHOL 113 04/26/2020   TRIG 142 04/26/2020  HDL 48 04/26/2020   LDLCALC 42 04/26/2020   Lab Results  Component Value Date   HAV NON-REACTIVE 04/26/2020   Lab Results  Component Value Date   HEPBSAG NON-REACTIVE 04/26/2020   HEPBSAB REACTIVE (A) 04/26/2020   No results found for: HCVAB Lab Results  Component Value Date   CHLAMYDIAWP Negative 04/26/2020    N Negative 04/26/2020   No results found for: GCPROBEAPT No results found for: QUANTGOLD   Syphilis Qunatiferon Vitamin D OI ppx if indicated HLA [id number] HPV Vacc [place] Anal Pap if indicated UA ( 6 monthly if on TDF) Dental    Health Maintenance: Immunization History  Administered Date(s) Administered  . Rabies, IM 05/19/2018, 05/23/2018  . Tdap 05/19/2018    Assessment/Plan: HIV, Treatment Naive Continue Biktarvy 1 tab PO daily Discussed pathogenesis, modes of transmission, modes of prevention and options of treatment  Follow up in 4 weeks   Smoking/Alcohol/Substance use  Counseled  STD Screening  Oral and Anal GC today   Immunization Has been vaccinated with 2 doses of Pfizer Refused Flu vaccine   I spent greater than 60 minutes with the patient including greater than 50% of time in face to face counsel of the patient and in coordination of their care.    Patient's labs were reviewed as well as his previous records. Patients questions were addressed and answered. Safe sex counseling done.   Rosiland Oz, MD Infectious Diseases  Office phone [phone] Fax no. [phone]

## 2020-05-16 NOTE — Patient Instructions (Signed)
What is HIV?--HIV is the name of a virus that can affect the body's "immune system," which is responsible for fighting infections. When people have untreated HIV infection, they can become sick easily. That's because their immune system cannot work as well to fight off infections or cancer. Even so, people with HIV can take medicines to control the virus, keep their immune system strong, and stay healthy for many years. People can get infected with HIV if blood or body fluid (such as semen or vaginal fluids) from a person with HIV enters their body. For example, a person can get HIV if he or she: ?Has sex without using a condom with someone who has HIV - This includes vaginal, anal, and oral sex. ?Shares needles or syringes with someone who has HIV What is AIDS?--AIDS is the term doctors use to describe the stage of HIV infection when the immune system is at its weakest. What are the symptoms of HIV?--When people first get infected with HIV, they can have a fever, sore throat, headache, muscle pain, and joint pain. These symptoms usually last about 2 weeks. In many cases, these symptoms are very mild. Most people with HIV infection do not even remember having them. In the first few years after infection, most people with HIV infection have no symptoms or only mild symptoms. Some people have swelling of small bean-shaped organs under the skin called lymph nodes, usually in the neck, armpit, or groin. This symptom can also happen in people who have HIV for a long time. People who have had untreated HIV for many years might have other problems, such as: ?Fever, stomach pain, nausea, vomiting, diarrhea, and weight loss ?Other infections, including: .Lung infections .Brain infections .Eye infections that cause trouble seeing .Yeast infections in the mouth that can cause soreness and raised, white patches Is there a test for HIV?--Yes. You can have HIV testing done in your doctor's office or clinic  using a sample of blood or sometimes saliva (spit). Results from some tests take a few days to come back. But results from rapid HIV tests can be ready within minutes. Most pharmacies also sell test kits that you can use at home. For one type, you use a special strip to collect a tiny bit of your blood, and mail the strip to a lab for testing. Another type of kit comes with a test strip that you wipe along your gums. If you take a home test that says you are HIV positive, see your doctor and ask for a follow-up test to make sure. How is HIV treated?--Doctors can prescribe different combinations of medicines to treat HIV. These are called "antiretroviral medicines." They work very well to keep HIV infection controlled in most people. You and your doctor should work together to decide when you should start treatment and which medicines are right for you. It is important to follow all of your doctor's instructions about treatment and take your HIV medicines every day. This is because your HIV can get worse if you skip or stop taking your medicines. Let your doctor know if you have any side effects or problems with your medicines. Some people with HIV also take other types of medicines every day to prevent HIV-related infections. For example, most doctors recommend that people with a low "T-cell count" take an antibiotic every day to keep from getting a lung infection called PCP. (T cells are a special type of white blood cell.) What if I am pregnant or want to   get pregnant?--If you have untreated HIV, your baby can become infected with HIV during pregnancy, birth, or through breastfeeding. If you are pregnant or want to get pregnant, talk with your doctor about ways to reduce the chance of passing HIV on to your baby. What can I do to prevent spreading HIV to other people?--To reduce the chance of spreading HIV to other people: ?Get tested for HIV and start treatment as early as possible ?Tell anyone you  plan to have sex with that you have HIV ?Use a condom every time you have vaginal, anal, or oral sex ?Do not share razors or toothbrushes with others ?Do not share drug needles or syringes with others  

## 2020-05-17 ENCOUNTER — Encounter: Payer: Self-pay | Admitting: *Deleted

## 2020-05-17 ENCOUNTER — Other Ambulatory Visit: Payer: Self-pay | Admitting: *Deleted

## 2020-05-17 DIAGNOSIS — B20 Human immunodeficiency virus [HIV] disease: Secondary | ICD-10-CM

## 2020-05-17 LAB — CYTOLOGY, (ORAL, ANAL, URETHRAL) ANCILLARY ONLY
Chlamydia: NEGATIVE
Chlamydia: NEGATIVE
Comment: NEGATIVE
Comment: NEGATIVE
Comment: NORMAL
Comment: NORMAL
Neisseria Gonorrhea: NEGATIVE
Neisseria Gonorrhea: NEGATIVE

## 2020-05-17 MED ORDER — SULFAMETHOXAZOLE-TRIMETHOPRIM 400-80 MG PO TABS: 1.0000 | ORAL_TABLET | Freq: Every day | ORAL | 3 refills | Status: DC

## 2020-05-17 NOTE — Progress Notes (Signed)
Sent bactrim prescription to local pharmacy. Andree Coss, RN

## 2020-06-12 ENCOUNTER — Encounter: Payer: Self-pay | Admitting: Infectious Diseases

## 2020-06-12 ENCOUNTER — Other Ambulatory Visit: Payer: Self-pay

## 2020-06-12 ENCOUNTER — Telehealth (INDEPENDENT_AMBULATORY_CARE_PROVIDER_SITE_OTHER): Payer: BC Managed Care – PPO | Admitting: Infectious Diseases

## 2020-06-12 DIAGNOSIS — B20 Human immunodeficiency virus [HIV] disease: Secondary | ICD-10-CM

## 2020-06-12 MED ORDER — SULFAMETHOXAZOLE-TRIMETHOPRIM 400-80 MG PO TABS: 1.0000 | ORAL_TABLET | Freq: Every day | ORAL | 2 refills | Status: DC

## 2020-06-12 MED ORDER — BIKTARVY 50-200-25 MG PO TABS: 1.0000 | ORAL_TABLET | Freq: Every day | ORAL | 2 refills | Status: DC

## 2020-06-12 NOTE — Progress Notes (Signed)
Virtual Visit via Telephone Note  I connected with@ on 06/12/20 at  9:15 AM EDT by a telephone enabled telemedicine application and verified that I am speaking with the correct person using two identifiers.  Location: Patient: Jordan Benjamin  Provider: Odette Fraction Location: [facility]    I discussed the limitations of evaluation and management by telemedicine and the availability of in person appointments. The patient expressed understanding and agreed to proceed.  [facility]   Patient's Medications  New Prescriptions   No medications on file  Previous Medications   BICTEGRAVIR-EMTRICITABINE-TENOFOVIR AF (BIKTARVY) 50-200-25 MG TABS TABLET    Take 1 tablet by mouth daily.   CETIRIZINE-PSEUDOEPHEDRINE (ZYRTEC-D) 5-120 MG TABLET    Take 1 tablet by mouth daily.  Modified Medications   Modified Medication Previous Medication   BICTEGRAVIR-EMTRICITABINE-TENOFOVIR AF (BIKTARVY) 50-200-25 MG TABS TABLET bictegravir-emtricitabine-tenofovir AF (BIKTARVY) 50-200-25 MG TABS tablet      Take 1 tablet by mouth daily.    Take 1 tablet by mouth daily.   SULFAMETHOXAZOLE-TRIMETHOPRIM (BACTRIM) 400-80 MG TABLET sulfamethoxazole-trimethoprim (BACTRIM) 400-80 MG tablet      Take 1 tablet by mouth daily.    Take 1 tablet by mouth daily.  Discontinued Medications   No medications on file   Subjective Jordan Benjamin is a 34 y.o.old male MSM with a PMH of HIVAIDS on Biktarvy who is here for regular follow up. Last VL was  3,18,000 copies/ml and CD4 101 IN 04/26/2020. He says he has been taking Biktarvy every single day in the morning. However, has missed 2 doses in total. Denies any side effect with the meds or barrier to adherence of treatment. He has been taking Bactrim daily as well. He was supposed to have an in person visit today but it was converted to phone visit by front desk while screening for COVID questionnaire. He denies being sexually active since diagnosis. He smokes  cigarettes and marijuana and denies using ant illicit drugs. He overall feels well and denies any complaints to me. He is going to come to the office tomorrow for lab work   ROS - 10 point ROS negative except as stated above   No past medical history on file.  Social History   Tobacco Use  . Smoking status: Current Every Day Smoker  . Smokeless tobacco: Never Used  . Tobacco comment: 1-2 cigarettes a day   Substance Use Topics  . Alcohol use: Yes    Comment: very rarely   . Drug use: Yes    Types: Marijuana    Comment: daily     No family history on file.  No Known Allergies  Health Maintenance  Topic Date Due  . COVID-19 Vaccine (1) Never done  . INFLUENZA VACCINE  Never done  . TETANUS/TDAP  05/19/2028  . Hepatitis C Screening  Completed  . 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 The patient was provided an opportunity to ask questions and all were answered. The patient agreed with the plan and demonstrated an understanding of the instructions.   The patient was  advised to call back or seek an in-person evaluation if the symptoms worsen or if the condition fails to improve as anticipated.  I provided 20 minutes of non-face-to-face time during this encounter.  Victoriano Lain, MD [facility] Phone [phone] Fax no. [phone]  06/12/2020, 9:19 AM

## 2020-06-12 NOTE — Patient Instructions (Signed)
Please have a blood drawn for HIV RNA as soon as possible depending upon your convenience. Continue taking meds daily Fu in 4 weeks

## 2020-06-13 ENCOUNTER — Other Ambulatory Visit: Payer: BC Managed Care – PPO

## 2020-07-22 ENCOUNTER — Encounter: Payer: Self-pay | Admitting: Infectious Diseases

## 2020-07-22 ENCOUNTER — Other Ambulatory Visit: Payer: Self-pay | Admitting: *Deleted

## 2020-07-22 ENCOUNTER — Other Ambulatory Visit: Payer: Self-pay

## 2020-07-22 ENCOUNTER — Ambulatory Visit (INDEPENDENT_AMBULATORY_CARE_PROVIDER_SITE_OTHER): Payer: BC Managed Care – PPO | Admitting: Infectious Diseases

## 2020-07-22 ENCOUNTER — Other Ambulatory Visit (HOSPITAL_COMMUNITY)
Admission: RE | Admit: 2020-07-22 | Discharge: 2020-07-22 | Disposition: A | Discharge status: Home / Self Care | Payer: BC Managed Care – PPO | Source: Ambulatory Visit | Attending: Infectious Diseases | Admitting: Infectious Diseases

## 2020-07-22 VITALS — BP 142/83 | HR 64 | Wt 156.6 lb

## 2020-07-22 DIAGNOSIS — B20 Human immunodeficiency virus [HIV] disease: Secondary | ICD-10-CM | POA: Diagnosis not present

## 2020-07-22 DIAGNOSIS — Z23 Encounter for immunization: Secondary | ICD-10-CM | POA: Insufficient documentation

## 2020-07-22 MED ORDER — BICTEGRAVIR-EMTRICITAB-TENOFOV 50-200-25 MG PO TABS: 1.0000 | ORAL_TABLET | Freq: Every day | ORAL | 3 refills | Status: DC

## 2020-07-22 MED ORDER — SULFAMETHOXAZOLE-TRIMETHOPRIM 400-80 MG PO TABS: 1.0000 | ORAL_TABLET | Freq: Every day | ORAL | 5 refills | Status: DC

## 2020-07-22 MED ORDER — BICTEGRAVIR-EMTRICITAB-TENOFOV 50-200-25 MG PO TABS: 1.0000 | ORAL_TABLET | Freq: Every day | ORAL | 5 refills | Status: DC

## 2020-07-22 MED ORDER — SULFAMETHOXAZOLE-TRIMETHOPRIM 400-80 MG PO TABS: 1.0000 | ORAL_TABLET | Freq: Every day | ORAL | 3 refills | Status: DC

## 2020-07-22 MED ORDER — BICTEGRAVIR-EMTRICITAB-TENOFOV 50-200-25 MG PO TABS
1.0000 | ORAL_TABLET | Freq: Every day | ORAL | 3 refills | Status: DC
Start: 2020-07-22 — End: 2020-07-22

## 2020-07-22 NOTE — Progress Notes (Addendum)
Jordan Benjamin [facility]                                                                                                                                                                                                       [address], [place], [place], [address]                                                                  Phn. [phone]; Fax: [phone]                                                                             Date: 11/22//21  Reason for Visit: HIV Initial Visit Primary Care provider: No PCP   HPI: Jordan Benjamin is a 34 y.o.old male with a PMH of HIV who is here for HIV follow up. Patient is a MSM. He was tested for HIV when he went to get Jordan Benjamin COVID vaccine in April 15, 2020. He initially  started taking Dovato for 2 weeks followed by [facility] from 06/12/20. Jordan Benjamin Kitchen   Patient says he is taking Biktarvy every day without missing any doses. Denies any associated side effects. He has also been taking Bactrim every single day without any missing doses although he has not taken bactrim for last 2 weeks due to difficulty in obtaining it. Denies having sexual activity for a long time. Smokes 1-2 cigarettes a day, alcohol is " once in a blue moon", smokes marijuana and denies using any drugs. 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 He is following up with dental clinic here for dental pain. Denies having fever, chills. No recent illness or hospitalisation. Overall doing well and no complaints.   ROS: Denies dysphagia, odynophagia, cough, fever, nausea, vomiting, diarrhea, constipation, weight loss, chills, night sweats, recent hospitalizations, rashes, joint complaints, shortness of breath, headaches, chest pain, abdominal pain, dysuri  Meds - Biktarvy, Bactrim and Cetrizine                  No Known Allergies  PMH:  None  Social History   Socioeconomic History  . Marital status: Single    Spouse name: Not on file  . Number of children: Not on file  .  Years of education: Not on file  . Highest education level: Not on file  Occupational History  . Not on file  Tobacco Use  . Smoking status: Current Every Day Smoker  . Smokeless tobacco: Never Used  . Tobacco comment: 1-2 cigarettes a day   Substance and Sexual Activity  . Alcohol use: Yes    Comment: very rarely   . Drug use: Yes    Types: Marijuana    Comment: daily   . Sexual activity: Not Currently    Partners: Male    Birth control/protection: Condom  Other Topics Concern  . Not on file  Social History Narrative  . Not on file   Social Determinants of Health   Financial Resource Strain:   . Difficulty of Paying Living Expenses: Not on file  Food Insecurity:   . Worried About Programme researcher, broadcasting/film/video in the Last Year: Not on file  . Ran Out of Food in the Last Year: Not on file  Transportation Needs:   . Lack of Transportation (Medical): Not on file  . Lack of Transportation (Non-Medical): Not on file  Physical Activity:   . Days of Exercise per Week: Not on file  . Minutes of Exercise per Session: Not on file  Stress:   . Feeling of Stress : Not on file  Social Connections:   . Frequency of Communication with Friends and Family: Not on file  . Frequency of Social Gatherings with Friends and Family: Not on file  . Attends Religious Services: Not on file  . Active Member of Clubs or Organizations: Not on file  . Attends Banker Meetings: Not on file  . Marital Status: Not on file  Intimate Partner Violence:   . Fear of Current or Ex-Partner: Not on file  . Emotionally Abused: Not on file  . Physically Abused: Not on file  . Sexually Abused: Not on file   VITALs  BP (!) 142/83   Pulse 64   Wt 156 lb 9.6 oz (71 kg)    Examination  Gen: Alert and oriented x 3, no acute distress HEENT: [place]/AT, PERL, no scleral icterus, no pale conjunctivae, hearing normal, oral mucosa moist, NO THRUSH,DENTAL CARIES+ Neck: Supple, no lymphadenopathy Cardio: Regular  rate and rhythm; +S1 and S2; no murmurs, gallops, or rubs Resp: CTAB; no wheezes, rhonchi, or rales GI: Soft, nontender, nondistended, bowel sounds present Extremities: No cyanosis, clubbing, or edema; +2 PT and DP pulses Skin: No rashes, lesions, or ecchymoses Neuro: No focal deficits Psych: Calm, cooperative  Lab Results HIV 1 RNA Quant (copies/mL)  Date Value  04/26/2020 318,000 (H)   CD4 T Cell Abs (/uL)  Date Value  04/26/2020 101 (L)   No results found for: HIV1GENOSEQ Lab Results  Component Value Date   WBC 3.9 04/26/2020   HGB 14.4 04/26/2020   HCT 43.8 04/26/2020   MCV 85.2 04/26/2020   PLT 212 04/26/2020    Lab Results  Component Value Date   CREATININE 1.13 04/26/2020   BUN 12 04/26/2020   NA 138 04/26/2020   K 4.5 04/26/2020   CL 103 04/26/2020   CO2 29 04/26/2020   Lab Results  Component Value Date   ALT 20 04/26/2020   AST 22 04/26/2020   BILITOT 0.4 04/26/2020  Lab Results  Component Value Date   CHOL 113 04/26/2020   TRIG 142 04/26/2020   HDL 48 04/26/2020   LDLCALC 42 04/26/2020   Lab Results  Component Value Date   HAV NON-REACTIVE 04/26/2020   Lab Results  Component Value Date   HEPBSAG NON-REACTIVE 04/26/2020   HEPBSAB REACTIVE (A) 04/26/2020   No results found for: HCVAB Lab Results  Component Value Date   CHLAMYDIAWP Negative 05/16/2020   CHLAMYDIAWP Negative 05/16/2020   N Negative 05/16/2020   N Negative 05/16/2020   No results found for: GCPROBEAPT No results found for: QUANTGOLD   Health Maintenance: Immunization History  Administered Date(s) Administered  . Rabies, IM 05/19/2018, 05/23/2018  . Tdap 05/19/2018    Assessment/Plan: AIDS Continue Biktarvy 1 tab PO daily Continue Bactrim 1 SS tab PO daily - Jordan Benjamin has got in touch pharmacy and says they are ready to be delivered. He will call us if he has any issues Follow up in 4 weeks for repeat HIV Viral RNA   Smoking Counseled  STD Screening  Refused as  he was recently tested a month ago  Immunization Has been vaccinated with 2 doses of Pfizer Refused other vaccine until his CD4 count is higher   I spent greater than 20  minutes with the patient including greater than 50% of time in face to face counsel of the patient and in coordination of their care.  Patient's labs were reviewed as well as his previous records. Patients questions were addressed and answered. Safe sex counseling done.   Odette Fraction, MD Infectious Diseases  Office phone [phone] Fax no. [phone]

## 2020-07-22 NOTE — Assessment & Plan Note (Signed)
Continue Biktarvy and Bactrim Fu in 4 weeks

## 2020-07-22 NOTE — Assessment & Plan Note (Signed)
Wants to hold off on recommended vaccines until CD4 count is higher which is OK

## 2020-07-23 LAB — T-HELPER CELL (CD4) - (RCID CLINIC ONLY)
CD4 % Helper T Cell: 8 % — ABNORMAL LOW (ref 33–65)
CD4 T Cell Abs: 176 /uL — ABNORMAL LOW (ref 400–1790)

## 2020-07-23 LAB — URINE CYTOLOGY ANCILLARY ONLY
Chlamydia: NEGATIVE
Comment: NEGATIVE
Comment: NORMAL
Neisseria Gonorrhea: NEGATIVE

## 2020-07-28 LAB — HIV-1 RNA ULTRAQUANT REFLEX TO GENTYP+
HIV 1 RNA Quant: 88 copies/mL — ABNORMAL HIGH
HIV-1 RNA Quant, Log: 1.94 Log copies/mL — ABNORMAL HIGH

## 2020-07-28 LAB — RPR: RPR Ser Ql: NONREACTIVE

## 2020-07-29 ENCOUNTER — Telehealth: Payer: Self-pay

## 2020-07-29 NOTE — Telephone Encounter (Signed)
-----   Message from Odette Fraction, MD sent at 07/29/2020  7:41 AM EST ----- Viral load has come down significantly to 88. Will check repeat Viral RNA in next visit  Please inform patient of the results.

## 2020-07-29 NOTE — Telephone Encounter (Signed)
Spoke with the patient to relay that his viral load has come down to 88 per Dr. Elinor Parkinson. Informed patient that we will recheck labs at his next appointment. RN encouraged patient to keep up the good work in taking his medication. Patient appreciative of call and verbalized understanding.   Sandie Ano, RN

## 2020-08-07 ENCOUNTER — Encounter: Payer: Self-pay | Admitting: Infectious Diseases

## 2020-08-15 ENCOUNTER — Ambulatory Visit: Payer: BC Managed Care – PPO | Admitting: Infectious Diseases

## 2020-08-16 ENCOUNTER — Ambulatory Visit (INDEPENDENT_AMBULATORY_CARE_PROVIDER_SITE_OTHER): Payer: BC Managed Care – PPO | Admitting: Infectious Diseases

## 2020-08-16 ENCOUNTER — Encounter: Payer: Self-pay | Admitting: Infectious Diseases

## 2020-08-16 ENCOUNTER — Other Ambulatory Visit: Payer: Self-pay

## 2020-08-16 VITALS — BP 120/75 | HR 78 | Temp 97.9°F | Resp 16 | Ht 73.0 in | Wt 156.0 lb

## 2020-08-16 DIAGNOSIS — Z23 Encounter for immunization: Secondary | ICD-10-CM

## 2020-08-16 DIAGNOSIS — B20 Human immunodeficiency virus [HIV] disease: Secondary | ICD-10-CM

## 2020-08-16 MED ORDER — SULFAMETHOXAZOLE-TRIMETHOPRIM 400-80 MG PO TABS: 1.0000 | ORAL_TABLET | Freq: Every day | ORAL | 3 refills | Status: DC

## 2020-08-16 MED ORDER — BICTEGRAVIR-EMTRICITAB-TENOFOV 50-200-25 MG PO TABS: 1.0000 | ORAL_TABLET | Freq: Every day | ORAL | 3 refills | Status: DC

## 2020-08-16 NOTE — Progress Notes (Signed)
[address], [place], [place], [address]                                                                  Phn. [phone]; Fax: [phone]                                                                             Date: 08/16/2020   Reason for Visit: HIV follow up    HPI: Jordan Benjamin is a 34 y.o.old male  ( MSM) with a history of HIV who is here for a follow up. He is taking Biktarvy and Bactrim every single day. Denies missing any doses. Denies any side effects related to it. Denies any barrriers to adherence of treatment. He is smoking cigarettes on and off whenever he get the " hit". Alcohol occasionally in the weekend and smokes marijuana everyday. He is planning to celebrate Christmas with his mother and sisters in [place]. He is working in [employer] and is happy working. He wants to get Hep A vaccine and PCV 13 vaccine. He is scheduled to see Dental in January 2021.  ROS: Denies dysphagia, odynophagia, cough, fever, nausea, vomiting, diarrhea, constipation, weight loss, chills, night sweats, recent hospitalizations, rashes, joint complaints, shortness of breath, headaches, chest pain, abdominal pain, dysuria .  Current Outpatient Medications on File Prior to Visit  Medication Sig Dispense Refill  . cetirizine-pseudoephedrine (ZYRTEC-D) 5-120 MG tablet Take 1 tablet by mouth daily. 30 tablet 0   No current facility-administered medications on file prior to visit.    No Known Allergies  Social History   Socioeconomic History  . Marital status: Single    Spouse name: Not on file  . Number of children: Not on file  . Years of education: Not on file  . Highest education level: Not on file  Occupational History  . Not on file  Tobacco Use  . Smoking status: Current  Every Day Smoker  . Smokeless tobacco: Never Used  . Tobacco comment: 1-2 cigarettes a day   Substance and Sexual Activity  . Alcohol use: Yes    Comment: very rarely   . Drug use: Yes    Types: Marijuana    Comment: daily   . Sexual activity: Not Currently    Partners: Male    Birth control/protection: Condom  Other Topics Concern  . Not on file  Social History Narrative  . Not on file   Social Determinants of Health   Financial Resource Strain: Not on file  Food Insecurity: Not on file  Transportation Needs: Not on file  Physical Activity: Not on file  Stress:  Not on file  Social Connections: Not on file  Intimate Partner Violence: Not on file    Vitals  BP 120/75   Pulse 78   Temp 97.9 F (36.6 C) (Oral)   Resp 16   Ht 6\' 1"  (1.854 m)   Wt 156 lb (70.8 kg)   SpO2 98%   BMI 20.58 kg/m    Examination  Gen: Alert and oriented x 3, no acute distress HEENT: [place]/AT, PERL, no scleral icterus, no pale conjunctivae, hearing normal, oral mucosa moist, DENTAL CAVITIES +, NO ORAL CANDIDIASIS  Neck: Supple, no lymphadenopathy Cardio: Regular rate and rhythm; +S1 and S2; no murmurs, gallops, or rubs Resp: CTAB; no wheezes, rhonchi, or rales GI: Soft, nontender, nondistended, bowel sounds present GU: Musc: Extremities: No cyanosis, clubbing, or edema; +2 PT and DP pulses Skin: No rashes, lesions, or ecchymoses Neuro: No focal deficits Psych: Calm, cooperative  Lab Results HIV 1 RNA Quant (copies/mL)  Date Value  07/22/2020 88 (H)  04/26/2020 318,000 (H)   CD4 T Cell Abs (/uL)  Date Value  07/22/2020 176 (L)  04/26/2020 101 (L)   No results found for: HIV1GENOSEQ Lab Results  Component Value Date   WBC 3.9 04/26/2020   HGB 14.4 04/26/2020   HCT 43.8 04/26/2020   MCV 85.2 04/26/2020   PLT 212 04/26/2020    Lab Results  Component Value Date   CREATININE 1.13 04/26/2020   BUN 12 04/26/2020   NA 138 04/26/2020   K 4.5 04/26/2020   CL 103 04/26/2020    CO2 29 04/26/2020   Lab Results  Component Value Date   ALT 20 04/26/2020   AST 22 04/26/2020   BILITOT 0.4 04/26/2020    Lab Results  Component Value Date   CHOL 113 04/26/2020   TRIG 142 04/26/2020   HDL 48 04/26/2020   LDLCALC 42 04/26/2020   Lab Results  Component Value Date   HAV NON-REACTIVE 04/26/2020   Lab Results  Component Value Date   HEPBSAG NON-REACTIVE 04/26/2020   HEPBSAB REACTIVE (A) 04/26/2020   No results found for: HCVAB Lab Results  Component Value Date   CHLAMYDIAWP Negative 07/22/2020   N Negative 07/22/2020   No results found for: GCPROBEAPT No results found for: QUANTGOLD   Health Maintenance: Immunization History  Administered Date(s) Administered  . Rabies, IM 05/19/2018, 05/23/2018  . Tdap 05/19/2018   Assessment/Plan: AIDS in a MSM Continue Bactrim and Biktarvy VL today Fu in 6-8 weeks  Smoking alcohol  Counseled   Immunization Hep A and PCV 13 today   I spent greater than 25 minutes with the patient including greater than 50% of time in face to face counsel of the patient and in coordination of their care.   Patient's labs were reviewed as well as his previous records. Patients questions were addressed and answered. Safe sex counseling done.   Electronically signed by:  05/21/2018, MD Infectious Disease Physician [facility] for Infectious Disease [address] [place], [place] Phone: [phone]  Fax: [phone]

## 2020-08-16 NOTE — Addendum Note (Signed)
Addended by: Clayborne Artist A on: 08/16/2020 11:13 AM   Modules accepted: Orders

## 2020-08-28 LAB — HIV-1 RNA ULTRAQUANT REFLEX TO GENTYP+
HIV 1 RNA Quant: 40 copies/mL — ABNORMAL HIGH
HIV-1 RNA Quant, Log: 1.6 Log copies/mL — ABNORMAL HIGH

## 2020-09-05 ENCOUNTER — Telehealth: Payer: Self-pay

## 2020-09-05 NOTE — Telephone Encounter (Signed)
[facility] Patient Advocate Encounter   Was successful in obtaining a [employer] copay card for [facility].  This copay card will make the patients copay 0.00.  I have spoken with the patient.           Clearance Coots, CPhT Specialty Pharmacy Patient [facility] for Infectious Disease Phone: [phone] Fax:  [phone]

## 2020-09-08 ENCOUNTER — Emergency Department (HOSPITAL_COMMUNITY)
Admission: EM | Admit: 2020-09-08 | Discharge: 2020-09-09 | Disposition: A | Discharge status: Home / Self Care | Payer: BC Managed Care – PPO | Attending: Emergency Medicine | Admitting: Emergency Medicine

## 2020-09-08 ENCOUNTER — Encounter (HOSPITAL_COMMUNITY): Payer: Self-pay | Admitting: Emergency Medicine

## 2020-09-08 DIAGNOSIS — L509 Urticaria, unspecified: Secondary | ICD-10-CM | POA: Diagnosis not present

## 2020-09-08 DIAGNOSIS — Z5321 Procedure and treatment not carried out due to patient leaving prior to being seen by health care provider: Secondary | ICD-10-CM | POA: Diagnosis not present

## 2020-09-08 DIAGNOSIS — T450X5A Adverse effect of antiallergic and antiemetic drugs, initial encounter: Secondary | ICD-10-CM | POA: Insufficient documentation

## 2020-09-08 DIAGNOSIS — R21 Rash and other nonspecific skin eruption: Secondary | ICD-10-CM | POA: Insufficient documentation

## 2020-09-08 HISTORY — DX: Immunodeficiency, unspecified: D84.9

## 2020-09-08 HISTORY — DX: Human immunodeficiency virus (HIV) disease: B20

## 2020-09-08 HISTORY — DX: Asymptomatic human immunodeficiency virus (hiv) infection status: Z21

## 2020-09-08 MED ORDER — DIPHENHYDRAMINE HCL 25 MG PO CAPS
50.0000 mg | ORAL_CAPSULE | Freq: Once | ORAL | Status: AC
  Administered 2020-09-08: 50 mg via ORAL

## 2020-09-08 NOTE — ED Triage Notes (Signed)
Pt reports rash to arms, back that he noted today when taking some new medications. Pt has hives to area. Took 1 expired benedryl with no relief of symptoms. VSS. No airway issues noted.

## 2020-09-09 ENCOUNTER — Other Ambulatory Visit: Payer: Self-pay

## 2020-09-09 ENCOUNTER — Encounter: Payer: Self-pay | Admitting: Infectious Diseases

## 2020-09-09 ENCOUNTER — Ambulatory Visit (INDEPENDENT_AMBULATORY_CARE_PROVIDER_SITE_OTHER): Payer: BC Managed Care – PPO | Admitting: Infectious Diseases

## 2020-09-09 DIAGNOSIS — B027 Disseminated zoster: Secondary | ICD-10-CM

## 2020-09-09 DIAGNOSIS — B20 Human immunodeficiency virus [HIV] disease: Secondary | ICD-10-CM

## 2020-09-09 DIAGNOSIS — B029 Zoster without complications: Secondary | ICD-10-CM | POA: Insufficient documentation

## 2020-09-09 MED ORDER — VALACYCLOVIR HCL 1 G PO TABS: 1000.0000 mg | ORAL_TABLET | Freq: Three times a day (TID) | ORAL | 0 refills | Status: AC

## 2020-09-09 MED ORDER — GABAPENTIN 100 MG PO CAPS: 100.0000 mg | ORAL_CAPSULE | Freq: Three times a day (TID) | ORAL | 0 refills | Status: DC

## 2020-09-09 NOTE — Progress Notes (Signed)
Subjective:    Jordan Benjamin is a 35 y.o. male patient recently started on Biktarvy and Bactrim for advanced HIV infection (CD4 101 nadir, VL recently < 20).   Overnight he had a very painful blistered rash come up on the left anterior chest above the nipple line, left armpit and underside of the upper arm extending to his back on the left side.  Prior to the onset of the rash he did notice that he had a lot of itching over this area the days prior leading up to the rash.  He has not had any fevers but does report some headaches that have started the last few days and a "twinge" sensation in the left ear intermittently.  No change in vision or hearing or balance/walking gait.  He thinks he has shingles.  His dad has had this rash a few times in the past.  No other explanation offered for the sudden onset rash.    Review of Systems: Constitutional: no fevers, chills, malaise HEENT: no sore throat, swollen glands or exudate  GI: no abdominal pain, diarrhea, change in bowel habits GU: no dysuria, burning, discharge or swelling of genitals Skin: + painful blistered rash over back, left arm and across left chest above nipple line Neuro: + headaches, denies any eye pain/vision changes, tinnitis or ear pain. Does have some "twinge" sensations in left ear sometimes over the last few days.     Past Medical History:  Diagnosis Date  . HIV (human immunodeficiency virus infection) (HCC)   . Immune deficiency disorder (HCC)     Objective:  Physical Exam  Constitutional: Well developed, well nourished, no acute distress. He  is alert and oriented x3.  Integument: Vesicular appearing erythematous rash extending from the upper back to the left side of the spine wrapping around underside of the left upper arm and across the left anterior chest above the nipple line as pictured.  He has 1 small spot on the neck that appears to be a vesicle as well although isolated. Neuro: alert and oriented,  normal sensation and gait.  Head: No vesicular lesions were observed on the scalp or around his face Eyes: equal round and reactive. Normal visual field. Normal EOMs.  Ears: Left ear examined revealing normal-appearing tympanic membrane, normal ear canal with no vesicles or drainage. 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  He has associated headaches - no lesions on face, head or near ears. Inner ear canals without any blisters.  Will treat with 1 g Valtrex 3 times daily for 10 days given immunosuppressed state.  He may need short-term prednisone to help manage the headaches.  No other signs of meningismus or encephalitis.  I asked him to send me an update via MyChart in 48 hours.  He may be somebody that warrants chronic suppression with valtrex until he has had an adequate immune system reconstitution.  Pain management discussed with topical lidocaine patches, gabapentin 100 mg capsules were given today titration discussed.  Sedating effect discussed. Wound care management discussed and recommended silicone foam dressings to heavily blistered areas.  Discussed that these blisters are considered contagious until they all ruptured and crusted over.  Best to keep them covered.  Will follow-up with Dr. Whitney Muse in 3 to 4 weeks.      Relevant Medications   valACYclovir (VALTREX) 1000 MG tablet   AIDS (acquired immune deficiency syndrome) (HCC)    Continues on Biktarvy and Bactrim for prophylaxis.  Recent viral load was undetectable.  We discussed and reviewed his labs and what these mean.  He seems to have good understanding.      Relevant Medications   valACYclovir (VALTREX) 1000 MG tablet       Rexene Alberts, MSN, NP-C [facility] for Infectious Disease [facility] Office: [phone] Pager: [phone]  09/09/20 1:13 PM

## 2020-09-09 NOTE — Assessment & Plan Note (Signed)
Continues on Biktarvy and Bactrim for prophylaxis.  Recent viral load was undetectable.  We discussed and reviewed his labs and what these mean.  He seems to have good understanding.

## 2020-09-09 NOTE — Assessment & Plan Note (Addendum)
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  He has associated headaches - no lesions on face, head or near ears. Inner ear canals without any blisters.  Will treat with 1 g Valtrex 3 times daily for 10 days given immunosuppressed state.  He may need short-term prednisone to help manage the headaches.  No other signs of meningismus or encephalitis.  I asked him to send me an update via MyChart in 48 hours.  He may be somebody that warrants chronic suppression with valtrex until he has had an adequate immune system reconstitution.  Pain management discussed with topical lidocaine patches, gabapentin 100 mg capsules were given today titration discussed.  Sedating effect discussed. Wound care management discussed and recommended silicone foam dressings to heavily blistered areas.  Discussed that these blisters are considered contagious until they all ruptured and crusted over.  Best to keep them covered.  Will follow-up with Dr. Whitney Muse in 3 to 4 weeks.

## 2020-09-09 NOTE — ED Notes (Signed)
Pt left due to not being seen quick enough 

## 2020-09-09 NOTE — Patient Instructions (Addendum)
New medications:  1. VALTREX - take 1 tablet three times a day for 10 days. This is an antiviral to treat the rash.  **let me know an update in 48 hours via mychart as to how you are doing and the headaches.   2. GABAPENTIN - this is a nerve pain medication. Start taking 1 capsule twice a day. Can increase to TWO capsules three times a day if needed.   OK to take tylenol for headaches or Ibuprofen as needed - we may need to start you on a short course of prednisone to help.    Let me know when you are done with the course of the valtrex - I think what we should do is consider doing a daily suppressive dose until your immune system gets stronger to try to prevent this from happening again.    FU in 3-4 weeks as scheduled with Dr. Amadeo Garnet    Shingles  Shingles is an infection. It gives you a painful skin rash and blisters that have fluid in them. Shingles is caused by the same germ (virus) that causes chickenpox. Shingles only happens in people who:  Have had chickenpox.  Have been given a shot of medicine (vaccine) to protect against chickenpox. Shingles is rare in this group. The first symptoms of shingles may be itching, tingling, or pain in an area on your skin. A rash will show on your skin a few days or weeks later. The rash is likely to be on one side of your body. The rash usually has a shape like a belt or a band. Over time, the rash turns into fluid-filled blisters. The blisters will break open, change into scabs, and dry up. Medicines may:  Help with pain and itching.  Help you get better sooner.  Help to prevent long-term problems. Follow these instructions at home: Medicines  Take over-the-counter and prescription medicines only as told by your doctor.  Put on an anti-itch cream or numbing cream where you have a rash, blisters, or scabs. Do this as told by your doctor. Helping with itching and discomfort  Put cold, wet cloths (cold compresses) on the area of the rash  or blisters as told by your doctor.  Cool baths can help you feel better. Try adding baking soda or dry oatmeal to the water to lessen itching. Do not bathe in hot water.   Blister and rash care  Keep your rash covered with a loose bandage (dressing).  Wear loose clothing that does not rub on your rash.  Keep your rash and blisters clean. To do this, wash the area with mild soap and cool water as told by your doctor.  Check your rash every day for signs of infection. Check for: ? More redness, swelling, or pain. ? Fluid or blood. ? Warmth. ? Pus or a bad smell.  Do not scratch your rash. Do not pick at your blisters. To help you to not scratch: ? Keep your fingernails clean and cut short. ? Wear gloves or mittens when you sleep, if scratching is a problem. General instructions  Rest as told by your doctor.  Keep all follow-up visits as told by your doctor. This is important.  Wash your hands often with soap and water. If soap and water are not available, use hand sanitizer. Doing this lowers your chance of getting a skin infection caused by germs (bacteria).  Your infection can cause chickenpox in people who have never had chickenpox or never got a shot  of chickenpox vaccine. If you have blisters that did not change into scabs yet, try not to touch other people or be around other people, especially: ? Babies. ? Pregnant women. ? Children who have areas of red, itchy, or rough skin (eczema). ? Very old people who have transplants. ? People who have a long-term (chronic) sickness, like cancer or AIDS. Contact a doctor if:  Your pain does not get better with medicine.  Your pain does not get better after the rash heals.  You have any signs of infection in the rash area. These signs include: ? More redness, swelling, or pain around the rash. ? Fluid or blood coming from the rash. ? The rash area feeling warm to the touch. ? Pus or a bad smell coming from the rash. Get help  right away if:  The rash is on your face or nose.  You have pain in your face or pain by your eye.  You lose feeling on one side of your face.  You have trouble seeing.  You have ear pain, or you have ringing in your ear.  You have a loss of taste.  Your condition gets worse. Summary  Shingles gives you a painful skin rash and blisters that have fluid in them.  Shingles is an infection. It is caused by the same germ (virus) that causes chickenpox.  Keep your rash covered with a loose bandage (dressing). Wear loose clothing that does not rub on your rash.  If you have blisters that did not change into scabs yet, try not to touch other people or be around people. This information is not intended to replace advice given to you by your health care provider. Make sure you discuss any questions you have with your health care provider. Document Revised: 12/09/2018 Document Reviewed: 04/21/2017 Elsevier Patient Education  2021 [employer].

## 2020-09-11 MED ORDER — PREDNISONE 10 MG PO TABS: 60.0000 mg | ORAL_TABLET | ORAL | 0 refills | Status: DC

## 2020-09-11 MED ORDER — ACETAMINOPHEN-CODEINE #3 300-30 MG PO TABS: 1.0000 | ORAL_TABLET | ORAL | 0 refills | Status: AC | PRN

## 2020-09-24 ENCOUNTER — Other Ambulatory Visit: Payer: Self-pay | Admitting: Infectious Diseases

## 2020-10-10 ENCOUNTER — Telehealth: Payer: Self-pay

## 2020-10-10 NOTE — Telephone Encounter (Signed)
[facility] Patient Advocate Encounter  [facility] have been unsuccsessful in reaching patient to be able to refill medication.    They have tried multiple times without a response.  Patient need to call [phone] to schedule his refills.  Clearance Coots, CPhT Specialty Pharmacy Patient [facility] for Infectious Disease Phone: [phone] Fax:  [phone]

## 2020-10-11 ENCOUNTER — Ambulatory Visit: Payer: BC Managed Care – PPO | Admitting: Infectious Diseases

## 2021-02-03 ENCOUNTER — Other Ambulatory Visit: Payer: Self-pay | Admitting: Pharmacist

## 2021-02-03 ENCOUNTER — Other Ambulatory Visit (HOSPITAL_COMMUNITY): Payer: Self-pay

## 2021-02-03 DIAGNOSIS — B20 Human immunodeficiency virus [HIV] disease: Secondary | ICD-10-CM

## 2021-02-03 MED ORDER — BICTEGRAVIR-EMTRICITAB-TENOFOV 50-200-25 MG PO TABS
1.0000 | ORAL_TABLET | Freq: Every day | ORAL | 1 refills | Status: DC
Start: 2021-02-03 — End: 2021-02-21
  Filled 2021-02-03 (×2): qty 30, 30d supply, fill #0

## 2021-02-03 MED ORDER — SULFAMETHOXAZOLE-TRIMETHOPRIM 400-80 MG PO TABS
1.0000 | ORAL_TABLET | Freq: Every day | ORAL | 1 refills | Status: DC
  Filled 2021-02-03 (×2): qty 30, 30d supply, fill #0

## 2021-02-03 NOTE — Progress Notes (Signed)
Sending Biktarvy and Bactrim to [facility] as patient got new insurance and can now fill locally.   Only sent one refill as patient needs to schedule a follow up appointment with Dr. Elinor Parkinson.

## 2021-02-05 ENCOUNTER — Other Ambulatory Visit: Payer: Self-pay

## 2021-02-05 ENCOUNTER — Other Ambulatory Visit: Payer: BC Managed Care – PPO

## 2021-02-05 DIAGNOSIS — B20 Human immunodeficiency virus [HIV] disease: Secondary | ICD-10-CM | POA: Diagnosis not present

## 2021-02-07 LAB — HIV-1 RNA QUANT-NO REFLEX-BLD
HIV 1 RNA Quant: 99200 Copies/mL — ABNORMAL HIGH
HIV-1 RNA Quant, Log: 5 Log cps/mL — ABNORMAL HIGH

## 2021-02-12 ENCOUNTER — Other Ambulatory Visit: Payer: Self-pay | Admitting: Infectious Diseases

## 2021-02-12 DIAGNOSIS — B20 Human immunodeficiency virus [HIV] disease: Secondary | ICD-10-CM

## 2021-02-21 ENCOUNTER — Ambulatory Visit (INDEPENDENT_AMBULATORY_CARE_PROVIDER_SITE_OTHER): Payer: BC Managed Care – PPO | Admitting: Infectious Diseases

## 2021-02-21 ENCOUNTER — Other Ambulatory Visit: Payer: Self-pay

## 2021-02-21 ENCOUNTER — Other Ambulatory Visit (HOSPITAL_COMMUNITY): Payer: Self-pay

## 2021-02-21 ENCOUNTER — Other Ambulatory Visit (HOSPITAL_COMMUNITY)
Admission: RE | Admit: 2021-02-21 | Discharge: 2021-02-21 | Disposition: A | Discharge status: Home / Self Care | Payer: BC Managed Care – PPO | Source: Ambulatory Visit | Attending: Infectious Diseases | Admitting: Infectious Diseases

## 2021-02-21 VITALS — BP 122/78 | HR 70 | Resp 16 | Ht 73.0 in | Wt 157.0 lb

## 2021-02-21 DIAGNOSIS — F17200 Nicotine dependence, unspecified, uncomplicated: Secondary | ICD-10-CM

## 2021-02-21 DIAGNOSIS — B028 Zoster with other complications: Secondary | ICD-10-CM | POA: Diagnosis not present

## 2021-02-21 DIAGNOSIS — Z113 Encounter for screening for infections with a predominantly sexual mode of transmission: Secondary | ICD-10-CM | POA: Diagnosis not present

## 2021-02-21 DIAGNOSIS — Z7185 Encounter for immunization safety counseling: Secondary | ICD-10-CM | POA: Diagnosis not present

## 2021-02-21 DIAGNOSIS — B20 Human immunodeficiency virus [HIV] disease: Secondary | ICD-10-CM | POA: Diagnosis not present

## 2021-02-21 MED ORDER — SULFAMETHOXAZOLE-TRIMETHOPRIM 400-80 MG PO TABS
1.0000 | ORAL_TABLET | Freq: Every day | ORAL | 3 refills | Status: DC
  Filled 2021-02-21: qty 30, 30d supply, fill #0

## 2021-02-21 MED ORDER — BICTEGRAVIR-EMTRICITAB-TENOFOV 50-200-25 MG PO TABS
1.0000 | ORAL_TABLET | Freq: Every day | ORAL | 3 refills | Status: DC
Start: 2021-02-21 — End: 2021-04-23
  Filled 2021-02-21 – 2021-02-25 (×2): qty 30, 30d supply, fill #0
  Filled 2021-03-25: qty 30, 30d supply, fill #1

## 2021-02-21 MED ORDER — VALACYCLOVIR HCL 500 MG PO TABS: 500.0000 mg | ORAL_TABLET | Freq: Two times a day (BID) | ORAL | Status: DC

## 2021-02-21 NOTE — Progress Notes (Signed)
[address], [place], [place], [address]                                                                  Phn. [phone]; Fax: [phone]                                                                             Date: 02/21/21  Reason for Visit: HIV follow up    HPI: Jordan Benjamin is a 35 y.o.old male  ( MSM) with a history of HIV who is here for a follow up.  02/21/21 He was off of ART for approximately 2 weeks before when his last lab draw on 6/8 when it was 99, 200.  However he was able to get in touch with pharmacy and was able to get his Biktarvy.  He has been taking his Biktarvy daily since then and has not missed any doses.  Denies any side effects concerning with the Biktarvy.  He is also taking Bactrim for prophylaxis.  He has completed treatment course of Valtrex and has resolution of his herpetic lesions in his trunk and has some scar left in his trunk. He denies being currently sexually active but is willing to have a urine screen for STDs.  Continues to smoke marijuana, denies alcohol and using drugs.  He is working with the [employer].  He does not want to be vaccinated today because he has to go for a wedding on this weekend and does not want to take a chance of being feeling sick.  He complains of occasional itching in his generalized body but no discrete lesions obvious on exam.  Discussed with him to use Eucerin cream as moisturizer to help with the itching for possible dry skin.   ROS: Denies dysphagia, odynophagia, cough, fever, nausea, vomiting, diarrhea, constipation, weight loss, chills, night sweats, recent hospitalizations, rashes, joint complaints, shortness of breath, headaches, chest pain, abdominal pain, dysuria.  Current Outpatient Medications on File Prior to Visit   Medication Sig Dispense Refill   cetirizine-pseudoephedrine (ZYRTEC-D) 5-120 MG tablet Take 1 tablet by mouth daily. 30 tablet 0   No current facility-administered medications on file prior to visit.    No Known Allergies  Social History   Socioeconomic History   Marital status: Single    Spouse name: Not on file   Number of children: Not on file   Years of education: Not on file   Highest education level: Not on file  Occupational History   Not on file  Tobacco Use   Smoking status: Every Day    Pack years: 0.00   Smokeless tobacco:  Never   Tobacco comments:    1-2 cigarettes a day   Substance and Sexual Activity   Alcohol use: Yes    Comment: very rarely    Drug use: Yes    Types: Marijuana    Comment: daily    Sexual activity: Not Currently    Partners: Male    Birth control/protection: Condom  Other Topics Concern   Not on file  Social History Narrative   Not on file   Social Determinants of Health   Financial Resource Strain: Not on file  Food Insecurity: Not on file  Transportation Needs: Not on file  Physical Activity: Not on file  Stress: Not on file  Social Connections: Not on file  Intimate Partner Violence: Not on file    Vitals  BP 122/78   Pulse 70   Resp 16   Ht 6\' 1"  (1.854 m)   Wt 157 lb (71.2 kg)   SpO2 98%   BMI 20.71 kg/m   Examination  Gen: Alert and oriented x 3, no acute distress HEENT: [place]/AT, no scleral icterus, no pale conjunctivae, hearing normal, oral mucosa moist,  NO ORAL CANDIDIASIS  Neck: Supple, no lymphadenopathy Cardio: Regular rate and rhythm; +S1 and S2; no murmurs, gallops, or rubs Resp: CTAB; no wheezes, rhonchi, or rales GI: Soft, nontender, nondistended GU: Musc: Extremities: No cyanosis, clubbing, or edema; +2 PT and DP pulses Skin: No rashes, lesions, or ecchymoses Neuro: No focal deficits Psych: Calm, cooperative  Lab Results HIV 1 RNA Quant  Date Value  02/05/2021 99,200 Copies/mL (H)  08/16/2020  40 copies/mL (H)  07/22/2020 88 copies/mL (H)   CD4 T Cell Abs (/uL)  Date Value  07/22/2020 176 (L)  04/26/2020 101 (L)   No results found for: HIV1GENOSEQ Lab Results  Component Value Date   WBC 3.9 04/26/2020   HGB 14.4 04/26/2020   HCT 43.8 04/26/2020   MCV 85.2 04/26/2020   PLT 212 04/26/2020    Lab Results  Component Value Date   CREATININE 1.13 04/26/2020   BUN 12 04/26/2020   NA 138 04/26/2020   K 4.5 04/26/2020   CL 103 04/26/2020   CO2 29 04/26/2020   Lab Results  Component Value Date   ALT 20 04/26/2020   AST 22 04/26/2020   BILITOT 0.4 04/26/2020    Lab Results  Component Value Date   CHOL 113 04/26/2020   TRIG 142 04/26/2020   HDL 48 04/26/2020   LDLCALC 42 04/26/2020   Lab Results  Component Value Date   HAV NON-REACTIVE 04/26/2020   Lab Results  Component Value Date   HEPBSAG NON-REACTIVE 04/26/2020   HEPBSAB REACTIVE (A) 04/26/2020   No results found for: HCVAB Lab Results  Component Value Date   CHLAMYDIAWP Negative 07/22/2020   N Negative 07/22/2020   No results found for: GCPROBEAPT No results found for: QUANTGOLD   Health Maintenance: Immunization History  Administered Date(s) Administered   Hepatitis A, Adult 08/16/2020   Pneumococcal Conjugate-13 08/16/2020   Rabies, IM 05/19/2018, 05/23/2018   Tdap 05/19/2018   Assessment/Plan: Problem List Items Addressed This Visit       Other   AIDS (acquired immune deficiency syndrome) (HCC)   Relevant Medications   bictegravir-emtricitabine-tenofovir AF (BIKTARVY) 50-200-25 MG TABS tablet   Shingles rash   Relevant Medications   bictegravir-emtricitabine-tenofovir AF (BIKTARVY) 50-200-25 MG TABS tablet   Screening examination for venereal disease   Immunization counseling   Smoking   HIV disease (HCC) - Primary  Relevant Medications   bictegravir-emtricitabine-tenofovir AF (BIKTARVY) 50-200-25 MG TABS tablet   Other Relevant Orders   HIV-1 RNA ultraquant reflex to gentyp+    RPR   Urine cytology ancillary only   Comprehensive metabolic panel (Completed)   Urinalysis (Completed)   Lipid panel (Completed)   AIDS in a MSM Continue Bactrim and Biktarvy Will do VL in next visit as done recently  Advised to contact our office if any troubles getting ART  Fu in 2 months   Smoking alcohol  Counseled   STD Screening Urine GC and RPR  Shingles  Has completed treatment, lesions in the trunks are crusted, no active lesions   Immunization Will re-discuss about recommended vaccines and plan in next visit ( PCV 23)  I spent greater than 25 minutes with the patient including greater than 50% of time in face to face counsel of the patient and in coordination of their care.   Patient's labs were reviewed as well as his previous records. Patients questions were addressed and answered. Safe sex counseling done.   Electronically signed by:  Odette Fraction, MD Infectious Disease Physician [facility] [address] [place], [place] [address] Phone: [phone]  Fax: [phone]

## 2021-02-22 ENCOUNTER — Other Ambulatory Visit (HOSPITAL_COMMUNITY): Payer: Self-pay

## 2021-02-22 DIAGNOSIS — Z7185 Encounter for immunization safety counseling: Secondary | ICD-10-CM | POA: Insufficient documentation

## 2021-02-22 DIAGNOSIS — F17200 Nicotine dependence, unspecified, uncomplicated: Secondary | ICD-10-CM | POA: Insufficient documentation

## 2021-02-22 DIAGNOSIS — Z113 Encounter for screening for infections with a predominantly sexual mode of transmission: Secondary | ICD-10-CM | POA: Insufficient documentation

## 2021-02-22 DIAGNOSIS — B20 Human immunodeficiency virus [HIV] disease: Secondary | ICD-10-CM | POA: Insufficient documentation

## 2021-02-22 LAB — URINALYSIS
Bilirubin Urine: NEGATIVE
Glucose, UA: NEGATIVE
Hgb urine dipstick: NEGATIVE
Leukocytes,Ua: NEGATIVE
Nitrite: NEGATIVE
Protein, ur: NEGATIVE
Specific Gravity, Urine: 1.025 (ref 1.001–1.035)
pH: 5.5 (ref 5.0–8.0)

## 2021-02-22 MED ORDER — SULFAMETHOXAZOLE-TRIMETHOPRIM 400-80 MG PO TABS
1.0000 | ORAL_TABLET | Freq: Every day | ORAL | 3 refills | Status: DC
  Filled 2021-02-22 – 2021-03-25 (×2): qty 30, 30d supply, fill #0

## 2021-02-24 ENCOUNTER — Telehealth: Payer: Self-pay

## 2021-02-24 LAB — URINE CYTOLOGY ANCILLARY ONLY
Chlamydia: NEGATIVE
Comment: NEGATIVE
Comment: NORMAL
Neisseria Gonorrhea: NEGATIVE

## 2021-02-24 NOTE — Telephone Encounter (Signed)
Patient called and made aware. Patient verbalized understanding.   Jordan Benjamin

## 2021-02-24 NOTE — Telephone Encounter (Signed)
-----   Message from Odette Fraction, MD sent at 02/22/2021  2:22 PM EDT ----- Regarding: Valtrex Please let him know that I have canceled the Valtrex order as he has completed the treatment for it and his lesions have healed.

## 2021-02-25 ENCOUNTER — Other Ambulatory Visit (HOSPITAL_COMMUNITY): Payer: Self-pay

## 2021-03-04 ENCOUNTER — Other Ambulatory Visit (HOSPITAL_COMMUNITY): Payer: Self-pay

## 2021-03-18 LAB — COMPREHENSIVE METABOLIC PANEL
AG Ratio: 1.3 (calc) (ref 1.0–2.5)
ALT: 12 U/L (ref 9–46)
AST: 20 U/L (ref 10–40)
Albumin: 4.7 g/dL (ref 3.6–5.1)
Alkaline phosphatase (APISO): 68 U/L (ref 36–130)
BUN: 10 mg/dL (ref 7–25)
CO2: 27 mmol/L (ref 20–32)
Calcium: 10.1 mg/dL (ref 8.6–10.3)
Chloride: 104 mmol/L (ref 98–110)
Creat: 1.22 mg/dL (ref 0.60–1.35)
Globulin: 3.5 g/dL (calc) (ref 1.9–3.7)
Glucose, Bld: 88 mg/dL (ref 65–99)
Potassium: 4.6 mmol/L (ref 3.5–5.3)
Sodium: 137 mmol/L (ref 135–146)
Total Bilirubin: 0.5 mg/dL (ref 0.2–1.2)
Total Protein: 8.2 g/dL — ABNORMAL HIGH (ref 6.1–8.1)

## 2021-03-18 LAB — HIV-1 RNA ULTRAQUANT REFLEX TO GENTYP+
HIV 1 RNA Quant: 313 copies/mL — ABNORMAL HIGH
HIV-1 RNA Quant, Log: 2.5 Log copies/mL — ABNORMAL HIGH

## 2021-03-18 LAB — RPR: RPR Ser Ql: NONREACTIVE

## 2021-03-18 LAB — LIPID PANEL
Cholesterol: 119 mg/dL (ref <200)
HDL: 62 mg/dL (ref >=40)
LDL Cholesterol (Calc): 42 mg/dL (calc)
Non-HDL Cholesterol (Calc): 57 mg/dL (calc) (ref <130)
Total CHOL/HDL Ratio: 1.9 (calc) (ref <5.0)
Triglycerides: 72 mg/dL (ref <150)

## 2021-03-25 ENCOUNTER — Other Ambulatory Visit (HOSPITAL_COMMUNITY): Payer: Self-pay

## 2021-03-31 ENCOUNTER — Other Ambulatory Visit (HOSPITAL_COMMUNITY): Payer: Self-pay

## 2021-04-02 ENCOUNTER — Other Ambulatory Visit (HOSPITAL_COMMUNITY): Payer: Self-pay

## 2021-04-21 ENCOUNTER — Other Ambulatory Visit (HOSPITAL_COMMUNITY): Payer: Self-pay

## 2021-04-23 ENCOUNTER — Encounter: Payer: Self-pay | Admitting: Infectious Diseases

## 2021-04-23 ENCOUNTER — Other Ambulatory Visit (HOSPITAL_COMMUNITY): Payer: Self-pay

## 2021-04-23 ENCOUNTER — Other Ambulatory Visit: Payer: Self-pay

## 2021-04-23 ENCOUNTER — Ambulatory Visit (INDEPENDENT_AMBULATORY_CARE_PROVIDER_SITE_OTHER): Payer: BC Managed Care – PPO | Admitting: Infectious Diseases

## 2021-04-23 VITALS — BP 135/88 | HR 65 | Temp 98.2°F | Ht 73.0 in | Wt 156.0 lb

## 2021-04-23 DIAGNOSIS — B20 Human immunodeficiency virus [HIV] disease: Secondary | ICD-10-CM

## 2021-04-23 DIAGNOSIS — Z7185 Encounter for immunization safety counseling: Secondary | ICD-10-CM

## 2021-04-23 DIAGNOSIS — Z23 Encounter for immunization: Secondary | ICD-10-CM | POA: Diagnosis not present

## 2021-04-23 MED ORDER — BICTEGRAVIR-EMTRICITAB-TENOFOV 50-200-25 MG PO TABS
1.0000 | ORAL_TABLET | Freq: Every day | ORAL | 3 refills | Status: DC
  Filled 2021-04-23 – 2021-04-25 (×2): qty 30, 30d supply, fill #0
  Filled 2021-06-17: qty 30, 30d supply, fill #1

## 2021-04-23 MED ORDER — SULFAMETHOXAZOLE-TRIMETHOPRIM 400-80 MG PO TABS
1.0000 | ORAL_TABLET | Freq: Every day | ORAL | 3 refills | Status: DC
  Filled 2021-04-23: qty 30, 30d supply, fill #0

## 2021-04-23 NOTE — Progress Notes (Signed)
[address], [place], [place], [address]                                                                  Phn. [phone]; Fax: [phone]                                                                             Date: 02/21/21  Reason for Visit: HIV follow up    HPI: Jordan Benjamin is a 35 y.o.old male  ( MSM) with a history of HIV, Shingles ( treated) who is here for a follow up.  02/21/21 He has been taking Biktarvy and bactrim daily, denies any missed doses. He is getting his prescriotions from [facility]. Denies being sexually active since the time of diagnosis. 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 His facility was alerted that there was a monkey pox noted in one of the animals in [place] ( not in his facility). He denies any new rashes, lymph node swelling or fevers or symptoms concerning for Monkey pox. He denies having any sexual activity in the last 3 months, STI diagnosis. 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 He is willing to get Hep A #2. He is following up with Dental and has some questions about billing. Overall doing well and no complains today.   ROS: Denies dysphagia, odynophagia, cough, fever, nausea, vomiting, diarrhea, constipation, weight loss, chills, night sweats, recent hospitalizations, rashes, joint complaints, shortness of breath, headaches, chest pain, abdominal pain, dysuria.  Current Outpatient Medications on File Prior to Visit  Medication Sig Dispense Refill   bictegravir-emtricitabine-tenofovir AF (BIKTARVY) 50-200-25 MG  TABS tablet Take 1 tablet by mouth daily. 30 tablet 3   cetirizine-pseudoephedrine (ZYRTEC-D) 5-120 MG tablet Take 1 tablet by mouth daily. 30 tablet 0   sulfamethoxazole-trimethoprim (BACTRIM) 400-80 MG tablet Take 1 tablet by mouth daily. 30 tablet 3   No current facility-administered medications on file prior to visit.    No Known Allergies  Social History   Socioeconomic History   Marital status: Single    Spouse name: Not on file   Number of children: Not on file   Years of education: Not on file   Highest education level: Not on file  Occupational History   Not on file  Tobacco Use   Smoking status: Every Day   Smokeless tobacco: Never   Tobacco  comments:    1-2 cigarettes a day   Substance and Sexual Activity   Alcohol use: Yes    Comment: very rarely    Drug use: Yes    Types: Marijuana    Comment: daily    Sexual activity: Not Currently    Partners: Male    Birth control/protection: Condom  Other Topics Concern   Not on file  Social History Narrative   Not on file   Social Determinants of Health   Financial Resource Strain: Not on file  Food Insecurity: Not on file  Transportation Needs: Not on file  Physical Activity: Not on file  Stress: Not on file  Social Connections: Not on file  Intimate Partner Violence: Not on file    Vitals  BP 135/88   Pulse 65   Temp 98.2 F (36.8 C) (Oral)   Ht 6\' 1"  (1.854 m)   Wt 156 lb (70.8 kg)   SpO2 100%   BMI 20.58 kg/m   Examination  Gen: Alert and oriented x 3, no acute distress HEENT: [place]/AT, no scleral icterus, no pale conjunctivae, hearing normal, oral mucosa moist,  NO ORAL CANDIDIASIS , DENTAL CARIES  Neck: Supple, no lymphadenopathy Cardio: Regular rate and rhythm; +S1 and S2; no murmurs, gallops, or rubs Resp: CTAB; no wheezes, rhonchi, or rales GI: Soft, nontender, nondistended GU: Musc: Extremities: No cyanosis, clubbing, or edema; +2 PT and DP pulses Skin: No rashes, lesions, or  ecchymoses Neuro: No focal deficits Psych: Calm, cooperative  Lab Results HIV 1 RNA Quant  Date Value  02/21/2021 313 copies/mL (H)  02/05/2021 99,200 Copies/mL (H)  08/16/2020 40 copies/mL (H)   CD4 T Cell Abs (/uL)  Date Value  07/22/2020 176 (L)  04/26/2020 101 (L)   No results found for: HIV1GENOSEQ Lab Results  Component Value Date   WBC 3.9 04/26/2020   HGB 14.4 04/26/2020   HCT 43.8 04/26/2020   MCV 85.2 04/26/2020   PLT 212 04/26/2020    Lab Results  Component Value Date   CREATININE 1.22 02/21/2021   BUN 10 02/21/2021   NA 137 02/21/2021   K 4.6 02/21/2021   CL 104 02/21/2021   CO2 27 02/21/2021   Lab Results  Component Value Date   ALT 12 02/21/2021   AST 20 02/21/2021   BILITOT 0.5 02/21/2021    Lab Results  Component Value Date   CHOL 119 02/21/2021   TRIG 72 02/21/2021   HDL 62 02/21/2021   LDLCALC 42 02/21/2021   Lab Results  Component Value Date   HAV NON-REACTIVE 04/26/2020   Lab Results  Component Value Date   HEPBSAG NON-REACTIVE 04/26/2020   HEPBSAB REACTIVE (A) 04/26/2020   No results found for: HCVAB Lab Results  Component Value Date   CHLAMYDIAWP Negative 02/21/2021   N Negative 02/21/2021   No results found for: GCPROBEAPT No results found for: QUANTGOLD   Health Maintenance: Immunization History  Administered Date(s) Administered   Hepatitis A, Adult 08/16/2020   Pneumococcal Conjugate-13 08/16/2020   Rabies, IM 05/19/2018, 05/23/2018   Tdap 05/19/2018   Assessment/Plan:  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   Immunization Discussed about recommended vaccines in [facility] Hep A #2 today  Health Maintenance Follows with Dental   I spent30 minutes with the  patient including greater than 50% of time in face to face counsel of the  patient and in coordination of their care.   Patient's labs were reviewed as well as his previous records. Patients questions were addressed and answered. Safe sex counseling done.   Electronically signed by:  Odette Fraction, MD Infectious Disease Physician [facility] for Infectious Disease [address] [place], [place] [address] Phone: [phone]  Fax: [phone]

## 2021-04-24 LAB — T-HELPER CELL (CD4) - (RCID CLINIC ONLY)
CD4 % Helper T Cell: 10 % — ABNORMAL LOW (ref 33–65)
CD4 T Cell Abs: 229 /uL — ABNORMAL LOW (ref 400–1790)

## 2021-04-25 ENCOUNTER — Other Ambulatory Visit (HOSPITAL_COMMUNITY): Payer: Self-pay

## 2021-04-26 LAB — HIV-1 RNA ULTRAQUANT REFLEX TO GENTYP+
HIV 1 RNA Quant: <20 copies/mL — AB
HIV-1 RNA Quant, Log: <1.3 Log copies/mL — AB

## 2021-04-28 ENCOUNTER — Telehealth: Payer: Self-pay

## 2021-04-28 ENCOUNTER — Other Ambulatory Visit (HOSPITAL_COMMUNITY): Payer: Self-pay

## 2021-04-28 NOTE — Telephone Encounter (Signed)
Called patient to relay results, no answer and voicemail box not set up. Will send MyChart message.   Sandie Ano, RN

## 2021-04-28 NOTE — Telephone Encounter (Signed)
-----   Message from Odette Fraction, MD sent at 04/27/2021  4:16 PM EDT ----- Please let him know that his viral load is undetectable.

## 2021-05-26 ENCOUNTER — Other Ambulatory Visit (HOSPITAL_COMMUNITY): Payer: Self-pay

## 2021-05-31 ENCOUNTER — Other Ambulatory Visit: Payer: Self-pay

## 2021-05-31 ENCOUNTER — Ambulatory Visit (HOSPITAL_COMMUNITY)
Admission: EM | Admit: 2021-05-31 | Discharge: 2021-05-31 | Disposition: A | Discharge status: Home / Self Care | Payer: BC Managed Care – PPO | Attending: Emergency Medicine | Admitting: Emergency Medicine

## 2021-05-31 ENCOUNTER — Encounter (HOSPITAL_COMMUNITY): Payer: Self-pay | Admitting: Emergency Medicine

## 2021-05-31 DIAGNOSIS — R21 Rash and other nonspecific skin eruption: Secondary | ICD-10-CM | POA: Diagnosis not present

## 2021-05-31 MED ORDER — MUPIROCIN CALCIUM 2 % EX CREA: 1.0000 "application " | TOPICAL_CREAM | Freq: Two times a day (BID) | CUTANEOUS | 0 refills | Status: DC

## 2021-05-31 NOTE — ED Triage Notes (Signed)
Pt c/o bumps all over body x 2 days

## 2021-05-31 NOTE — Discharge Instructions (Signed)
Apply the Bactroban to the area on your left arm.  You can take Tylenol and/or ibuprofen as needed for pain relief and fever reduction. You can apply anti-itch cream such as hydrocortisone as needed. Apply a gauze or bandage to the rash and do not scratch.  Wear a mask in public or around other people.  We will contact you if the lab results are positive.  Return or go to the Emergency Department if symptoms worsen or do not improve in the next few days.

## 2021-05-31 NOTE — ED Provider Notes (Signed)
[facility]    CSN: [id number] Arrival date & time: 05/31/21  1210      History   Chief Complaint Chief Complaint  Patient presents with   Other   Bumps    HPI Jordan Benjamin is a 35 y.o. male.   Patient here for evaluation of rash all over her body that has been ongoing for the past 2 days.  Patient also does report some fatigue and a fever at home.  Temperature 99.1 in office.  Reports concern about possible monkey box.  Patient does have a history of HIV and does have sex with men.  No known exposures.  Reports using anti-itch cream with minimal symptom relief.  Denies any trauma, injury, or other precipitating event.  Denies any specific alleviating or aggravating factors.  Denies any chest pain, shortness of breath, N/V/D, numbness, tingling, weakness, abdominal pain, or headaches.    The history is provided by the patient.   Past Medical History:  Diagnosis Date   HIV (human immunodeficiency virus infection) (HCC)    Immune deficiency disorder [facility])     Patient Active Problem List   Diagnosis Date Noted   Screening examination for venereal disease 02/22/2021   Immunization counseling 02/22/2021   Smoking 02/22/2021   HIV disease (HCC) 02/22/2021   Shingles rash 09/09/2020   AIDS (acquired immune deficiency syndrome) (HCC) 07/22/2020    History reviewed. No pertinent surgical history.     Home Medications    Prior to Admission medications   Medication Sig Start Date End Date Taking? Authorizing Provider  bictegravir-emtricitabine-tenofovir AF (BIKTARVY) 50-200-25 MG TABS tablet Take 1 tablet by mouth daily. 04/23/21  Yes Odette Fraction, MD  mupirocin cream (BACTROBAN) 2 % Apply 1 application topically 2 (two) times daily. 05/31/21  Yes Ivette Loyal, NP  sulfamethoxazole-trimethoprim (BACTRIM) 400-80 MG tablet Take 1 tablet by mouth daily. 04/23/21  Yes Odette Fraction, MD  cetirizine-pseudoephedrine (ZYRTEC-D) 5-120 MG tablet Take 1 tablet by  mouth daily. 05/23/18   Emi Holes, PA-C    Family History History reviewed. No pertinent family history.  Social History Social History   Tobacco Use   Smoking status: Every Day   Smokeless tobacco: Former    Quit date: 10/01/2020   Tobacco comments:    1-2 cigarettes a day; quit February 2022  Vaping Use   Vaping Use: Never used  Substance Use Topics   Alcohol use: Yes    Comment: very rarely    Drug use: Yes    Types: Marijuana    Comment: daily      Allergies   Patient has no known allergies.   Review of Systems Review of Systems  Skin:  Positive for rash.  All other systems reviewed and are negative.   Physical Exam Triage Vital Signs ED Triage Vitals [05/31/21 1335]  Enc Vitals Group     BP 137/78     Pulse Rate 82     Resp      Temp 99.1 F (37.3 C)     Temp Source Oral     SpO2 98 %     Weight      Height      Head Circumference      Peak Flow      Pain Score 0     Pain Loc      Pain Edu?      Excl. in GC?    No data found.  Updated Vital Signs BP 137/78 (BP Location:  Left Arm)   Pulse 82   Temp 99.1 F (37.3 C) (Oral)   SpO2 98%   Visual Acuity Right Eye Distance:   Left Eye Distance:   Bilateral Distance:    Right Eye Near:   Left Eye Near:    Bilateral Near:     Physical Exam Vitals and nursing note reviewed.  Constitutional:      General: He is not in acute distress.    Appearance: Normal appearance. He is not ill-appearing, toxic-appearing or diaphoretic.  HENT:     Head: Normocephalic and atraumatic.  Eyes:     Conjunctiva/sclera: Conjunctivae normal.  Cardiovascular:     Rate and Rhythm: Normal rate.     Pulses: Normal pulses.  Pulmonary:     Effort: Pulmonary effort is normal.  Abdominal:     General: Abdomen is flat.  Musculoskeletal:        General: Normal range of motion.     Cervical back: Normal range of motion.  Skin:    General: Skin is warm and dry.     Findings: Rash (diffuse across body,  lesions with wide distribution) present. Rash is pustular.  Neurological:     General: No focal deficit present.     Mental Status: He is alert and oriented to person, place, and time.  Psychiatric:        Mood and Affect: Mood normal.     UC Treatments / Results  Labs (all labs ordered are listed, but only abnormal results are displayed) Labs Reviewed  MONKEYPOX VIRUS DNA, QUALITATIVE REAL-TIME PCR    EKG   Radiology No results found.  Procedures Procedures (including critical care time)  Medications Ordered in UC Medications - No data to display  Initial Impression / Assessment and Plan / UC Course  I have reviewed the triage vital signs and the nursing notes.  Pertinent labs & imaging results that were available during my care of the patient were reviewed by me and considered in my medical decision making (see chart for details).    Assessment negative for red flags or concerns.  Patient does engage in high risk behavior so we will obtain sample to test for monkey box.  May use Bactroban to help prevent infection.  Discussed covering lesions and avoid scratching.  May apply anti-itch cream as needed.  May take Tylenol and or ibuprofen as needed.  Encourage fluids and rest.  Recommend wearing a mask in public until lesions heal.  Follow-up with primary care provider.  Final Clinical Impressions(s) / UC Diagnoses   Final diagnoses:  Rash and nonspecific skin eruption     Discharge Instructions      Apply the Bactroban to the area on your left arm.  You can take Tylenol and/or ibuprofen as needed for pain relief and fever reduction. You can apply anti-itch cream such as hydrocortisone as needed. Apply a gauze or bandage to the rash and do not scratch.  Wear a mask in public or around other people.  We will contact you if the lab results are positive.  Return or go to the Emergency Department if symptoms worsen or do not improve in the next few days.    ED  Prescriptions     Medication Sig Dispense Auth. Provider   mupirocin cream (BACTROBAN) 2 % Apply 1 application topically 2 (two) times daily. 15 g Ivette Loyal, NP      PDMP not reviewed this encounter.   Ivette Loyal, NP 05/31/21 1423

## 2021-06-03 ENCOUNTER — Telehealth: Payer: Self-pay

## 2021-06-03 NOTE — Telephone Encounter (Signed)
Called patient in regards to concerns of rash, patient went yo urgent care to receive care, scheduled follow up with provider.

## 2021-06-04 LAB — MONKEYPOX VIRUS DNA, QUALITATIVE REAL-TIME PCR: Orthopoxvirus DNA, QL PCR: DETECTED — AB

## 2021-06-05 ENCOUNTER — Telehealth: Payer: Self-pay | Admitting: Internal Medicine

## 2021-06-05 IMAGING — DX DG ABDOMEN 1V
1 series · 1 of 1 positions shown · non-contrast
Comparison: No prior.

CLINICAL DATA: Abdominal pain.

EXAM:
ABDOMEN - 1 VIEW

[abdomen kub]
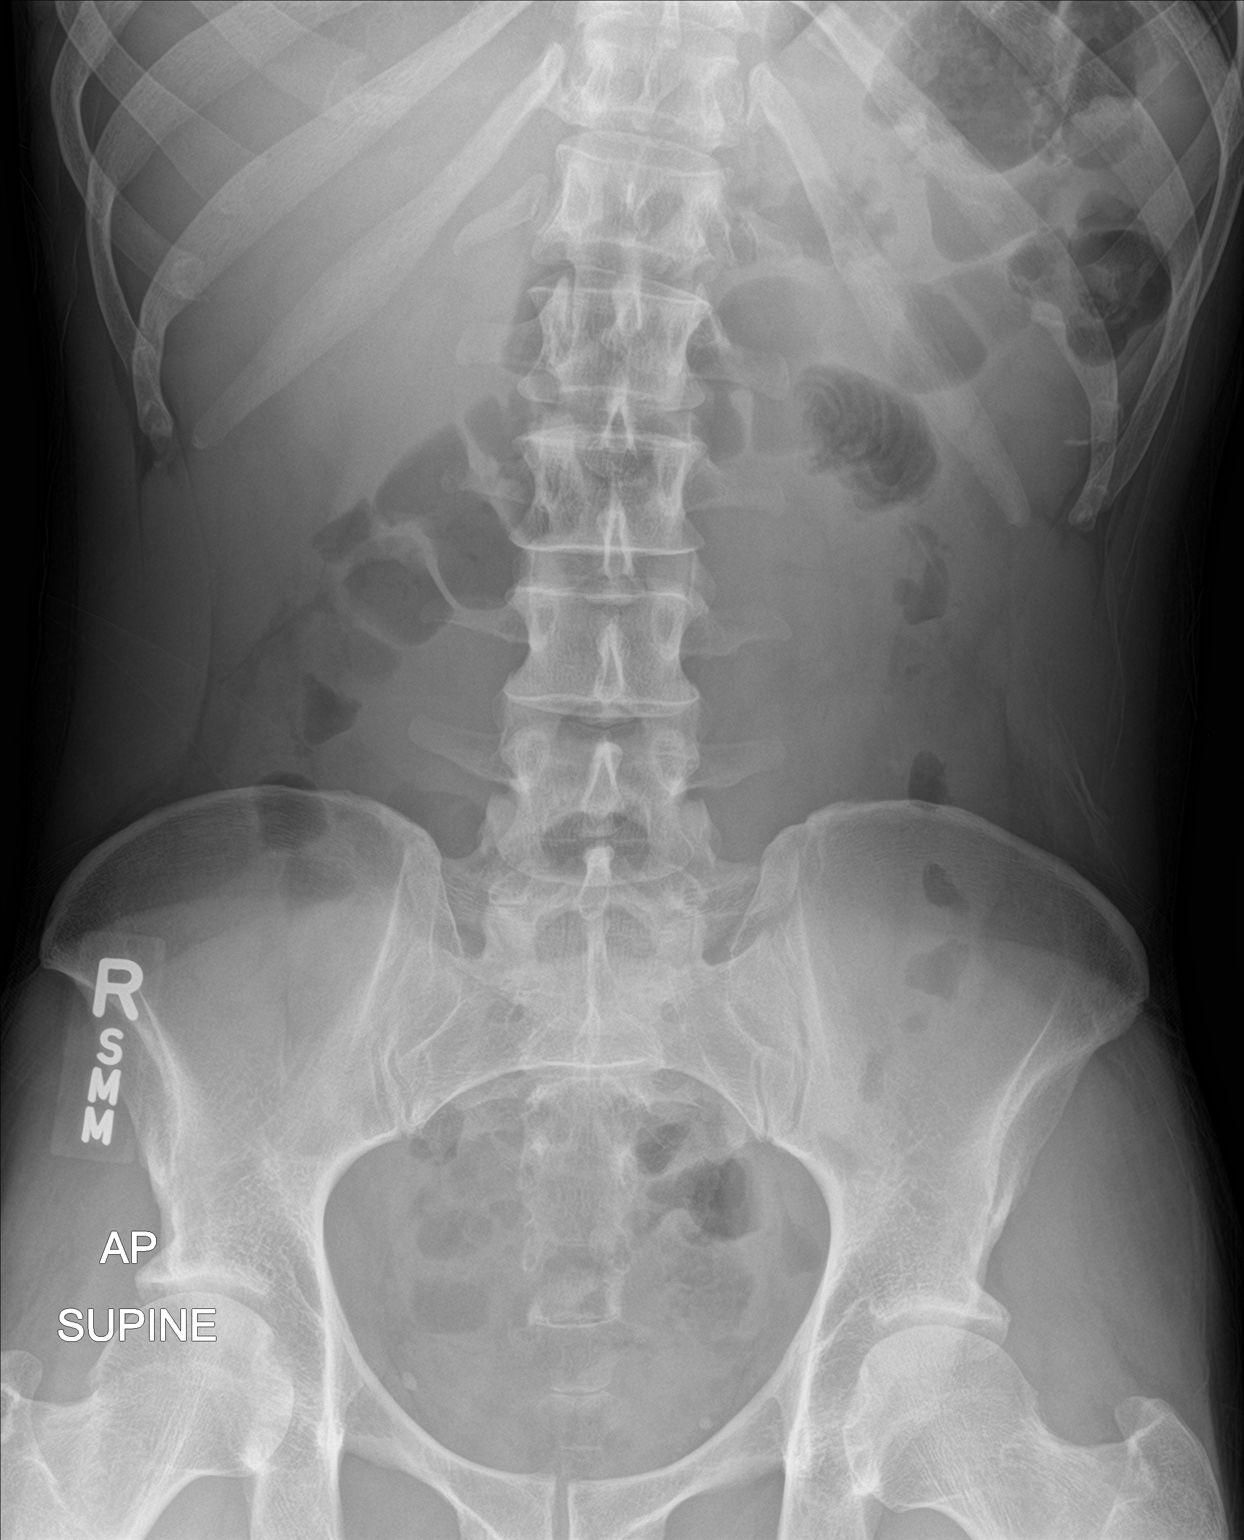

[1 of 1 positions shown; findings below may reference images not displayed]

FINDINGS: Soft tissue structures are unremarkable. Nonspecific air-filled loop
of small bowel noted. Colonic gas pattern is normal. No free air.
Pelvic calcifications consistent phleboliths. No acute bony
abnormality.
IMPRESSION: Single nonspecific loops of air-filled small bowel noted. Colonic
gas pattern is normal. No free air. If symptoms persist follow-up
abdominal series can be obtained.

## 2021-06-05 NOTE — Telephone Encounter (Signed)
Referral to the Infectious disease team placed

## 2021-06-05 NOTE — Telephone Encounter (Signed)
Patient was notified of positive Monkeypox result.  Patient was informed that he will need to follow up with infectious disease for treatment.  Reports that he has spoken with infectious disease and has follow up in place.  No additional questions at this time.

## 2021-06-06 ENCOUNTER — Encounter: Payer: Self-pay | Admitting: Physician Assistant

## 2021-06-06 ENCOUNTER — Other Ambulatory Visit: Payer: Self-pay

## 2021-06-06 ENCOUNTER — Ambulatory Visit (INDEPENDENT_AMBULATORY_CARE_PROVIDER_SITE_OTHER): Payer: BC Managed Care – PPO | Admitting: Physician Assistant

## 2021-06-06 VITALS — BP 124/81 | HR 82 | Temp 98.1°F | Wt 152.8 lb

## 2021-06-06 DIAGNOSIS — B04 Monkeypox: Secondary | ICD-10-CM | POA: Diagnosis not present

## 2021-06-06 MED ORDER — TPOXX 200 MG PO CAPS: 600.0000 mg | ORAL_CAPSULE | Freq: Two times a day (BID) | ORAL | 0 refills | Status: AC

## 2021-06-06 NOTE — Patient Instructions (Signed)
 START Tecovirimat - 3 capsules taken TOGETHER twice a day with high fat meal before your medication, as mentioned below    You need 25 grams of fat 30 minutes before each dose of your medication:   This could look like:  -1 avocado -1/4 cup whole cashews  -1/2 cup almonds  -3 tablespoons of nut butter (almond, peanut, cashew)  -4 oz of cooked ground beef 80/20 fat content  -2.5 tablespoons of butter (put it on a bagel, bread!) -3.5 oz goat cheese (regular fat)   You may still experience new lesions coming up over the next 72 hours as the medication begins to work.   Please consider filling out the daily medication / symptom tracker for your convenience. Bring this with you to each follow up visit so we can see how your progress is doing.   You will need to stay isolated at home with well fitting mask over your nose and mouth for 3 weeks after your first rash came up, or until they are all crusted and fall off, whichever is first.   Disinfect common surfaces, bed & bath linens and clothing.   Would like to see you back in 1 week to discuss further - can be done over video visit if you prefer and are overall doing well.   See below for more information.    HUMAN MONKEYPOX ISOLATION RECOMMENDATIONS https://www.cdc.gov/poxvirus/monkeypox/clinicians/isolation-procedures.html  A person with monkeypox can spread it to others from the time symptoms start until the rash has fully healed and a fresh layer of skin has formed. The illness typically lasts 2-4 weeks  Monkeypox can spread to anyone through close, personal, often skin-to-skin contact, including: Direct contact with monkeypox rash, scabs, or body fluids from a person with monkeypox. Touching objects, fabrics (clothing, bedding, or towels), and surfaces that have been used by someone with monkeypox. Contact with respiratory secretions. This direct contact can happen during intimate contact, including: Oral, anal, and vaginal sex  or touching the genitals (penis, testicles, labia, and vagina) or anus (butthole) of a person with monkeypox. Hugging, massage, and kissing. Prolonged face-to-face contact. Touching fabrics and objects during sex that were used by a person with monkeypox and that have not been disinfected, such as bedding, towels, fetish gear, and sex toys. A pregnant person can spread the virus to their fetus through the placenta. It's also possible for people to get monkeypox from infected animals, either by being scratched or bitten by the animal or by preparing or eating meat or using products from an infected animal. Isolation typically lasts two to four weeks.   If you are unable to isolate from others:  While symptomatic with a fever or any respiratory symptoms, including sore throat, nasal congestion, or cough, remain isolated in the home and away from others unless it is necessary to see a healthcare provider or for an emergency. This includes avoiding close or physical contact with other people and animals. Cover the lesions, wear a well-fitting mask (more information below), and avoid public transportation when leaving the home as required for medical care or an emergency. While a rash persists but in the absence of a fever or respiratory symptoms Cover all parts of the rash with clothing, gloves, and/or bandages. Wear a well-fitting mask to prevent the wearer from spreading oral and respiratory secretions when interacting with others until the rash and all other symptoms have resolved. Masks should fit closely on the face without any gaps along the edges or around the nose   and be comfortable when worn properly over the nose and mouth.  Until all signs and symptoms of monkeypox illness have fully resolved Do not share items that have been worn or handled with other people or animals. Launder or disinfect items that have been worn or handled and surfaces that have been touched by a lesion. Avoid close  physical contact, including sexual and/or close intimate contact, with other people. Avoid sharing utensils or cups. Items should be cleaned and disinfected before use by others. Avoid crowds and congregate settings. Wash hands often with soap and water or use an alcohol-based hand sanitizer, especially after direct contact with the rash.    Prevention in your Pets:  People with monkeypox should avoid contact with animals (specifically mammals), including pets. If possible, friends or family members should care for healthy animals until the owner has fully recovered. Keep any potentially infectious bandages, textiles (such as clothes, bedding) and other items away from pets, other domestic animals, and wildlife. In general, any mammal may become infected with monkeypox. It is not thought that other animals such as reptiles, fish or birds can be infected. If you notice an animal that had contact with an infected person appears sick (such as lethargy, lack of appetite, coughing, bloating, nasal or eye secretions or crust, fever, rash) contact the owner's veterinarian, state public health veterinarian, or state animal health official.   

## 2021-06-06 NOTE — Progress Notes (Signed)
Subjective:    Patient ID: Jordan Benjamin, male    DOB: [birth year], 35 y.o.   MRN: [id number]  Chief Complaint  Patient presents with   OTHER    Confirmed MPX, worsening proctitis, seeking TPOXX     HPI:  Jordan Benjamin is a 35 y.o. male with well controlled HIV-1, he has been adherent to [place] and tolerating well.  Last VL undetectable 02/21/21.  Went to ED 05/31/21 due to new onset lesions and malaise with onset 05/29/21. Lesions initially found on shoulder and spread diffusely along entire body.  He reports penile lesions and proctitis.  Lesions have begun to scab and some have epithelialized, however the rectal pain with bowel movements is "10/10 painful.' Initially lesion on left deltoid was infected he treated with neomycin samples which alleviated pain and swelling, redness. He has had no new lesions in 1 week. They are located on face, scalp, extremities, sole of left foot, anogenitals, torso, back. He has used colace, petroleum jelly, OTC hemorrhoidal ointment without relief.  Lesions totaling approximately 20.  He reports LAD has improved, chills, fever, malaise resolved.  He has been isolating since 05/29/21 and working as Control and instrumentation engineer.  He lives alone and has been cleaning his house every 2 days. He has no pets. He is MSM, with no known contact with confirmed MPX.     No Known Allergies    Outpatient Medications Prior to Visit  Medication Sig Dispense Refill   bictegravir-emtricitabine-tenofovir AF (BIKTARVY) 50-200-25 MG TABS tablet Take 1 tablet by mouth daily. 30 tablet 3   cetirizine-pseudoephedrine (ZYRTEC-D) 5-120 MG tablet Take 1 tablet by mouth daily. 30 tablet 0   mupirocin cream (BACTROBAN) 2 % Apply 1 application topically 2 (two) times daily. 15 g 0   sulfamethoxazole-trimethoprim (BACTRIM) 400-80 MG tablet Take 1 tablet by mouth daily. 30 tablet 3   No facility-administered medications prior to visit.     Past Medical History:  Diagnosis Date    HIV (human immunodeficiency virus infection) (HCC)    Immune deficiency disorder (HCC)      History reviewed. No pertinent surgical history.     Review of Systems  Constitutional:  Negative for appetite change, chills, diaphoresis, fatigue, fever and unexpected weight change.  HENT:  Negative for mouth sores, postnasal drip, sinus pain and sore throat.   Eyes:  Negative for redness and visual disturbance.  Respiratory:  Negative for shortness of breath.   Cardiovascular:  Negative for chest pain and palpitations.  Gastrointestinal:  Positive for rectal pain. Negative for anal bleeding, blood in stool, constipation, diarrhea, nausea and vomiting.  Genitourinary:  Positive for genital sores. Negative for hematuria, penile discharge, penile pain, penile swelling, scrotal swelling, testicular pain and urgency.  Musculoskeletal:  Negative for arthralgias, gait problem and neck stiffness.  Skin:  Positive for rash.  Allergic/Immunologic: Negative for immunocompromised state.  Neurological:  Negative for weakness, light-headedness and headaches.  Hematological:  Positive for adenopathy.  Psychiatric/Behavioral: Negative.       Objective:    BP 124/81   Pulse 82   Temp 98.1 F (36.7 C) (Oral)   Wt 152 lb 12.8 oz (69.3 kg)   SpO2 98%   BMI 20.16 kg/m  Nursing note and vital signs reviewed.  Physical Exam Vitals reviewed. Chaperone present: declined chaperone.  Constitutional:      General: He is not in acute distress.    Appearance: Normal appearance. He is normal weight. He is not ill-appearing.  HENT:  Head: Normocephalic and atraumatic.     Nose:     Comments: Lesion located along left nare crusted, raised, nttp, erythema at margins, deep seated, no d/c or exudate    Mouth/Throat:     Mouth: Mucous membranes are moist.     Pharynx: Oropharynx is clear.  Eyes:     Extraocular Movements: Extraocular movements intact.     Conjunctiva/sclera: Conjunctivae normal.      Pupils: Pupils are equal, round, and reactive to light.  Cardiovascular:     Rate and Rhythm: Normal rate and regular rhythm.  Pulmonary:     Effort: Pulmonary effort is normal.     Breath sounds: Normal breath sounds.  Genitourinary:    Penis: Circumcised. Lesions (3 vesicular lesions 1 cm diameter, nttp, marins with erythema, no induration, exudate or d/c) present. No swelling.      Testes: Normal.  Musculoskeletal:        General: Normal range of motion.  Lymphadenopathy:     Cervical: Cervical adenopathy (bilaterally) present.     Lower Body: Right inguinal adenopathy present. Left inguinal adenopathy present.  Skin:    Findings: Lesion and rash present. Rash is crusting and vesicular.     Comments: Widly spread MPX lesions at varying stages of healing. Some crusted over, some vesicular, some have epithelialized, none with induration, tenderness, d/c or exudate.  Neurological:     General: No focal deficit present.     Mental Status: He is alert and oriented to person, place, and time.  Psychiatric:        Mood and Affect: Mood normal.        Behavior: Behavior normal.     Depression screen [facility] 2/9 04/23/2021 02/21/2021 08/16/2020 07/22/2020 06/12/2020  Decreased Interest 0 0 0 0 0  Down, Depressed, Hopeless 0 0 0 0 0  PHQ - 2 Score 0 0 0 0 0       Assessment & Plan:  HMPX-supportive care discussed: Miralax, colace, tylenol, ibuprofen, sitz baths, lidocaine cream -Discussed management, isolation, progression of lesions, treatment.  Due to ongoing proctitis we discussed the need for TPOXX.  Read with patient the CDC EA IND TPOXX, all questions answered, plenty of time provided to review. Participant signed IC, and was provided a copy.  Intake form, original IC sent to [facility], Cassie K. Clinical pharmacist brought patient TPOXX instructed to use 600 mg twice daily twice a day x 14 days 30 mins after high caloirc fatty meal.  -Continue to isolate, letter for work extended to next visit  06/13/21.  -Disinfect surfacesa dn sterilize linens -condoms, and STI screening next visit.  HIV-1 continue Biktarvy as instructed   Patient Active Problem List   Diagnosis Date Noted   Screening examination for venereal disease 02/22/2021   Immunization counseling 02/22/2021   Smoking 02/22/2021   HIV disease (HCC) 02/22/2021   Shingles rash 09/09/2020   AIDS (acquired immune deficiency syndrome) (HCC) 07/22/2020     Problem List Items Addressed This Visit   None Visit Diagnoses     Human monkeypox    -  Primary   Relevant Medications   tecovirimat (TPOXX) capsule        I am having Francesca Jewett start on Tpoxx. I am also having him maintain his cetirizine-pseudoephedrine, bictegravir-emtricitabine-tenofovir AF, sulfamethoxazole-trimethoprim, and mupirocin cream.   Meds ordered this encounter  Medications   tecovirimat (TPOXX) capsule    Sig: Take 3 capsules (600 mg total) by mouth 2 (two) times daily with a  meal for 14 days.    Dispense:  84 capsule    Refill:  0    Order Specific Question:   Supervising Provider    Answer:   VAN DAM, CORNELIUS N [[id number]]     Follow-up: Return in about 1 week (around 06/13/2021) for TPOXX treatment for MPX.

## 2021-06-13 ENCOUNTER — Encounter: Payer: Self-pay | Admitting: Physician Assistant

## 2021-06-13 ENCOUNTER — Ambulatory Visit (INDEPENDENT_AMBULATORY_CARE_PROVIDER_SITE_OTHER): Payer: BC Managed Care – PPO | Admitting: Physician Assistant

## 2021-06-13 ENCOUNTER — Other Ambulatory Visit: Payer: Self-pay

## 2021-06-13 DIAGNOSIS — B04 Monkeypox: Secondary | ICD-10-CM | POA: Diagnosis not present

## 2021-06-13 NOTE — Progress Notes (Signed)
Subjective:    Patient ID: Jordan Benjamin, male    DOB: July 17, 1986, 35 y.o.   MRN: [id number]  Chief Complaint  Patient presents with   Follow-up    HMPX treatment with TPOXX     HPI:  Jordan Benjamin is a 35 y.o. male presents today for follow up regarding HMPX and TPOXX treatment.  He began treatment 06/07/2021.  He had one new lesions first day of treatment, otherwise no new lesions. He has tolerated TPOXX and taken as directed without adverse events or SAEs.  He has remained isolated at home and been diligently disinfecting and sterilizing home.  He has not pets or roommates.  He is a Economist.  He reports 90% improvement since last visit.  He denies pain with bowel movements, and rectal pain.  His lesions have almost entirely begun to epithelialize, 2 remain crusted over. Lesions along rectum are no longer painful, hypopigmentation is present at all sites.     No Known Allergies    Outpatient Medications Prior to Visit  Medication Sig Dispense Refill   bictegravir-emtricitabine-tenofovir AF (BIKTARVY) 50-200-25 MG TABS tablet Take 1 tablet by mouth daily. 30 tablet 3   cetirizine-pseudoephedrine (ZYRTEC-D) 5-120 MG tablet Take 1 tablet by mouth daily. 30 tablet 0   mupirocin cream (BACTROBAN) 2 % Apply 1 application topically 2 (two) times daily. 15 g 0   sulfamethoxazole-trimethoprim (BACTRIM) 400-80 MG tablet Take 1 tablet by mouth daily. 30 tablet 3   tecovirimat (TPOXX) capsule Take 3 capsules (600 mg total) by mouth 2 (two) times daily with a meal for 14 days. 84 capsule 0   No facility-administered medications prior to visit.     Past Medical History:  Diagnosis Date   HIV (human immunodeficiency virus infection) (HCC)    Immune deficiency disorder (HCC)      No past surgical history on file.     Review of Systems  Constitutional: Negative.   HENT: Negative.    Eyes: Negative.   Respiratory:  Negative.    Cardiovascular: Negative.   Gastrointestinal: Negative.   Genitourinary: Negative.   Skin:  Positive for rash (almost resolved MPX lesions).  Neurological:  Negative for dizziness.  Hematological:  Negative for adenopathy.  Psychiatric/Behavioral: Negative.       Objective:    There were no vitals taken for this visit. Nursing note and vital signs reviewed.  Physical Exam Constitutional:      General: He is not in acute distress.    Appearance: Normal appearance. He is not ill-appearing.  HENT:     Head: Normocephalic and atraumatic.  Eyes:     Extraocular Movements: Extraocular movements intact.     Conjunctiva/sclera: Conjunctivae normal.     Pupils: Pupils are equal, round, and reactive to light.  Cardiovascular:     Rate and Rhythm: Normal rate and regular rhythm.  Pulmonary:     Effort: Pulmonary effort is normal.     Breath sounds: Normal breath sounds.  Genitourinary:    Penis: Normal.      Testes: Normal.  Musculoskeletal:        General: Normal range of motion.  Skin:    General: Skin is warm.     Findings: Lesion (2 mpx sites crusted over, no induration or tenderness noted, all others have begun to epithelialize, no signs of secondary infection) present.  Neurological:     General: No focal deficit present.     Mental Status: He  is alert and oriented to person, place, and time.  Psychiatric:        Mood and Affect: Mood normal.        Behavior: Behavior normal.        Thought Content: Thought content normal.        Judgment: Judgment normal.     Depression screen [facility] 2/9 04/23/2021 02/21/2021 08/16/2020 07/22/2020 06/12/2020  Decreased Interest 0 0 0 0 0  Down, Depressed, Hopeless 0 0 0 0 0  PHQ - 2 Score 0 0 0 0 0       Assessment & Plan:  HMPX-I expect complete resolution of symptoms by 06/16/21-note to return to work -continue disinfecting home until that time -condoms for 12 weeks following resolution -declined further STI screening  today   Patient Active Problem List   Diagnosis Date Noted   Screening examination for venereal disease 02/22/2021   Immunization counseling 02/22/2021   Smoking 02/22/2021   HIV disease (HCC) 02/22/2021   Shingles rash 09/09/2020   AIDS (acquired immune deficiency syndrome) (HCC) 07/22/2020     Problem List Items Addressed This Visit   None   I am having Francesca Jewett maintain his cetirizine-pseudoephedrine, bictegravir-emtricitabine-tenofovir AF, sulfamethoxazole-trimethoprim, mupirocin cream, and Tpoxx.   No orders of the defined types were placed in this encounter.    Follow-up: No follow-ups on file.

## 2021-06-13 NOTE — Patient Instructions (Signed)
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

## 2021-06-17 ENCOUNTER — Other Ambulatory Visit (HOSPITAL_COMMUNITY): Payer: Self-pay

## 2021-06-24 ENCOUNTER — Ambulatory Visit (INDEPENDENT_AMBULATORY_CARE_PROVIDER_SITE_OTHER): Payer: BC Managed Care – PPO | Admitting: Infectious Diseases

## 2021-06-24 ENCOUNTER — Encounter: Payer: Self-pay | Admitting: Infectious Diseases

## 2021-06-24 ENCOUNTER — Other Ambulatory Visit: Payer: Self-pay

## 2021-06-24 ENCOUNTER — Other Ambulatory Visit (HOSPITAL_COMMUNITY): Payer: Self-pay

## 2021-06-24 VITALS — BP 117/74 | HR 81 | Wt 157.0 lb

## 2021-06-24 DIAGNOSIS — Z113 Encounter for screening for infections with a predominantly sexual mode of transmission: Secondary | ICD-10-CM | POA: Diagnosis not present

## 2021-06-24 DIAGNOSIS — Z7185 Encounter for immunization safety counseling: Secondary | ICD-10-CM | POA: Diagnosis not present

## 2021-06-24 DIAGNOSIS — B20 Human immunodeficiency virus [HIV] disease: Secondary | ICD-10-CM | POA: Diagnosis not present

## 2021-06-24 DIAGNOSIS — B04 Monkeypox: Secondary | ICD-10-CM | POA: Diagnosis not present

## 2021-06-24 MED ORDER — BICTEGRAVIR-EMTRICITAB-TENOFOV 50-200-25 MG PO TABS
1.0000 | ORAL_TABLET | Freq: Every day | ORAL | 4 refills | Status: DC
  Filled 2021-06-24 – 2021-08-05 (×2): qty 30, 30d supply, fill #0
  Filled 2021-09-23: qty 30, 30d supply, fill #1
  Filled 2021-10-17: qty 30, 30d supply, fill #2
  Filled 2022-02-17: qty 30, 30d supply, fill #3
  Filled 2022-03-05: qty 30, 30d supply, fill #4

## 2021-06-24 MED ORDER — SULFAMETHOXAZOLE-TRIMETHOPRIM 400-80 MG PO TABS
1.0000 | ORAL_TABLET | Freq: Every day | ORAL | 4 refills | Status: DC
  Filled 2021-06-24 – 2021-07-14 (×2): qty 30, 30d supply, fill #0
  Filled 2021-08-05: qty 30, 30d supply, fill #1
  Filled 2021-09-23: qty 30, 30d supply, fill #2
  Filled 2021-10-17: qty 30, 30d supply, fill #3
  Filled 2022-02-17: qty 30, 30d supply, fill #4

## 2021-06-24 NOTE — Progress Notes (Signed)
[address], [place], [place], [address]                                                                  Phn. [phone]; Fax: [phone]                                                                             Date: 06/24/21  Reason for Visit: HIV follow up    HPI: Jordan Benjamin is a 35 y.o.old male  ( MSM) with a history of HIV, Shingles ( treated), recent Monkey pox s/p tx who is here for a follow up.  06/24/21 Taking Biktarvy every day. Denies missed doses or barriers to adherence of tx. Recently treated for Monkey pox with TPOX in the setting pf proctitis. He tells me his symptoms has resolved and the rashes have also scabbed except one in the left arm which is pink as the scab fell off while taking a shower this morning. Denies being sexually active but had a kiss. Working in Sales executive. Smokes marijuana every day, denies alcohol and using drugs. Refused PCV 23 and Flu vaccine. No other complaints.   ROS: Denies dysphagia, odynophagia, cough, fever, nausea, vomiting, diarrhea, constipation, weight loss, chills, night sweats, recent hospitalizations, rashes, joint complaints, shortness of breath, headaches, chest pain, abdominal pain, dysuria.  Current Outpatient Medications on File Prior to Visit  Medication Sig Dispense Refill   bictegravir-emtricitabine-tenofovir AF (BIKTARVY) 50-200-25 MG TABS tablet Take 1 tablet by mouth daily. 30 tablet 3   sulfamethoxazole-trimethoprim (BACTRIM) 400-80 MG tablet Take 1 tablet by mouth daily. 30 tablet 3   cetirizine-pseudoephedrine (ZYRTEC-D) 5-120 MG tablet Take 1 tablet by mouth daily. 30 tablet 0   mupirocin cream (BACTROBAN) 2 % Apply 1 application topically 2 (two) times daily. 15 g 0   No current facility-administered medications on  file prior to visit.    No Known Allergies  Social History   Socioeconomic History   Marital status: Single    Spouse name: Not on file   Number of children: Not on file   Years of education: Not on file   Highest education level: Not on file  Occupational History   Not on file  Tobacco Use   Smoking status: Every Day   Smokeless tobacco: Former    Quit date: 10/01/2020   Tobacco comments:    1-2 cigarettes a day; quit February 2022  Vaping Use   Vaping Use: Never used  Substance and Sexual Activity   Alcohol use: Yes    Comment: very rarely    Drug use: Yes    Types: Marijuana  Comment: daily    Sexual activity: Not Currently    Partners: Male    Birth control/protection: Condom    Comment: declined condoms 03/2021  Other Topics Concern   Not on file  Social History Narrative   Not on file   Social Determinants of Health   Financial Resource Strain: Not on file  Food Insecurity: Not on file  Transportation Needs: Not on file  Physical Activity: Not on file  Stress: Not on file  Social Connections: Not on file  Intimate Partner Violence: Not on file    Vitals  BP 117/74   Pulse 81   Wt 157 lb (71.2 kg)   SpO2 99%   BMI 20.71 kg/m   Examination  Gen: Alert and oriented x 3, no acute distress HEENT: [place]/AT, no scleral icterus, no pale conjunctivae, hearing normal, oral mucosa moist,  NO ORAL CANDIDIASIS , DENTAL CARIES  Neck: Supple, no lymphadenopathy Cardio: Regular rate and rhythm Resp: CTAB GI: Soft, nontender, nondistended GU: Musc: Extremities: No cyanosis, clubbing, or edema Skin:healed lesions of monkey pox with one pink area in the left arm  Neuro: No focal deficits Psych: Calm, cooperative  Lab Results HIV 1 RNA Quant  Date Value  04/23/2021 <20 DETECTED copies/mL (A)  02/21/2021 313 copies/mL (H)  02/05/2021 99,200 Copies/mL (H)   CD4 T Cell Abs (/uL)  Date Value  04/23/2021 229 (L)  07/22/2020 176 (L)  04/26/2020 101 (L)   No  results found for: HIV1GENOSEQ Lab Results  Component Value Date   WBC 3.9 04/26/2020   HGB 14.4 04/26/2020   HCT 43.8 04/26/2020   MCV 85.2 04/26/2020   PLT 212 04/26/2020    Lab Results  Component Value Date   CREATININE 1.22 02/21/2021   BUN 10 02/21/2021   NA 137 02/21/2021   K 4.6 02/21/2021   CL 104 02/21/2021   CO2 27 02/21/2021   Lab Results  Component Value Date   ALT 12 02/21/2021   AST 20 02/21/2021   BILITOT 0.5 02/21/2021    Lab Results  Component Value Date   CHOL 119 02/21/2021   TRIG 72 02/21/2021   HDL 62 02/21/2021   LDLCALC 42 02/21/2021   Lab Results  Component Value Date   HAV NON-REACTIVE 04/26/2020   Lab Results  Component Value Date   HEPBSAG NON-REACTIVE 04/26/2020   HEPBSAB REACTIVE (A) 04/26/2020   No results found for: HCVAB Lab Results  Component Value Date   CHLAMYDIAWP Negative 02/21/2021   N Negative 02/21/2021   No results found for: GCPROBEAPT No results found for: QUANTGOLD   Health Maintenance: Immunization History  Administered Date(s) Administered   Hepatitis A, Adult 08/16/2020, 04/23/2021   Pneumococcal Conjugate-13 08/16/2020   Rabies, IM 05/19/2018, 05/23/2018   Tdap 05/19/2018   Assessment/Plan: HIV in a MSM Taking Biktarvy, he tells me he has been taking Bactrim every other day as he was not sent bactrim from the pharmacy. Advised to call pharmacy. Continue Bactrim and Biktarvy for now, will plan to DC bactrim if HIV RNA undetectable and CD4 count over 200 today  Meds refilled HIV RNA and CD4 today  Follow up in 4 months for VL  Monkey Pox S/p tx  All areas have scabbed except one in the left arm which is raw as the scab fell off while taking a shower  Advised to cover   STD Screening Urine GC and RPR  Immunization Discussed about recommended vaccines in [facility] Refused vaccines today  Health Maintenance Follows with Dental   I spent 30 minutes with the patient including greater than 50% of time  in face to face counsel of the patient and in coordination of their care.   Patient's labs were reviewed as well as his previous records. Patients questions were addressed and answered. Safe sex counseling done.   Electronically signed by:  Odette Fraction, MD Infectious Disease Physician [facility] Infectious [facility] [address] [place], [place] [address] Phone: [phone]  Fax: [phone]

## 2021-07-14 ENCOUNTER — Other Ambulatory Visit (HOSPITAL_COMMUNITY): Payer: Self-pay

## 2021-08-05 ENCOUNTER — Other Ambulatory Visit (HOSPITAL_COMMUNITY): Payer: Self-pay

## 2021-08-07 ENCOUNTER — Other Ambulatory Visit (HOSPITAL_COMMUNITY): Payer: Self-pay

## 2021-08-12 ENCOUNTER — Other Ambulatory Visit (HOSPITAL_COMMUNITY): Payer: Self-pay

## 2021-08-27 ENCOUNTER — Other Ambulatory Visit (HOSPITAL_COMMUNITY): Payer: Self-pay

## 2021-08-29 ENCOUNTER — Other Ambulatory Visit (HOSPITAL_COMMUNITY): Payer: Self-pay

## 2021-09-18 ENCOUNTER — Other Ambulatory Visit (HOSPITAL_COMMUNITY): Payer: Self-pay

## 2021-09-22 ENCOUNTER — Other Ambulatory Visit (HOSPITAL_COMMUNITY): Payer: Self-pay

## 2021-09-23 ENCOUNTER — Other Ambulatory Visit (HOSPITAL_COMMUNITY): Payer: Self-pay

## 2021-09-24 ENCOUNTER — Other Ambulatory Visit (HOSPITAL_COMMUNITY): Payer: Self-pay

## 2021-10-17 ENCOUNTER — Other Ambulatory Visit (HOSPITAL_COMMUNITY): Payer: Self-pay

## 2021-10-23 ENCOUNTER — Other Ambulatory Visit (HOSPITAL_COMMUNITY): Payer: Self-pay

## 2021-10-24 ENCOUNTER — Other Ambulatory Visit (HOSPITAL_COMMUNITY): Payer: Self-pay

## 2021-10-28 ENCOUNTER — Telehealth: Payer: Self-pay

## 2021-10-28 ENCOUNTER — Other Ambulatory Visit (HOSPITAL_COMMUNITY): Payer: Self-pay

## 2021-10-28 NOTE — Telephone Encounter (Signed)
[facility] Patient Advocate Encounter  Completed and sent [employer] Advancing Access application for Biktarvy for this patient who is uninsured.    Patient is approved 10/28/21 through 08/30/22.     Clearance Coots, CPhT Specialty Pharmacy Patient Leader [facility] for Infectious Disease Phone: [phone] Fax:  [phone]

## 2021-11-14 ENCOUNTER — Other Ambulatory Visit (HOSPITAL_COMMUNITY): Payer: Self-pay

## 2021-11-17 ENCOUNTER — Other Ambulatory Visit (HOSPITAL_COMMUNITY): Payer: Self-pay

## 2021-11-19 ENCOUNTER — Other Ambulatory Visit (HOSPITAL_COMMUNITY): Payer: Self-pay

## 2021-12-03 ENCOUNTER — Other Ambulatory Visit (HOSPITAL_COMMUNITY): Payer: Self-pay

## 2021-12-18 ENCOUNTER — Other Ambulatory Visit (HOSPITAL_COMMUNITY): Payer: Self-pay

## 2022-02-17 ENCOUNTER — Other Ambulatory Visit (HOSPITAL_COMMUNITY): Payer: Self-pay

## 2022-02-18 ENCOUNTER — Other Ambulatory Visit (HOSPITAL_COMMUNITY): Payer: Self-pay

## 2022-03-05 ENCOUNTER — Other Ambulatory Visit (HOSPITAL_COMMUNITY): Payer: Self-pay

## 2022-03-05 ENCOUNTER — Telehealth: Payer: Self-pay

## 2022-03-05 ENCOUNTER — Other Ambulatory Visit: Payer: Self-pay | Admitting: Infectious Diseases

## 2022-03-05 DIAGNOSIS — B20 Human immunodeficiency virus [HIV] disease: Secondary | ICD-10-CM

## 2022-03-05 MED ORDER — SULFAMETHOXAZOLE-TRIMETHOPRIM 400-80 MG PO TABS
1.0000 | ORAL_TABLET | Freq: Every day | ORAL | 0 refills | Status: DC
  Filled 2022-03-05: qty 30, 30d supply, fill #0

## 2022-03-05 NOTE — Telephone Encounter (Signed)
Notified patient that Dr. Elinor Parkinson would like for him to continue Bactrim at least until she sees him for his next appointment. Patient verbalized understanding and has no further questions.   Sandie Ano, RN

## 2022-03-05 NOTE — Telephone Encounter (Signed)
Ok to refill for an month and we can discuss about continuing in upcoming appointment.

## 2022-03-05 NOTE — Telephone Encounter (Signed)
Received refill request for Bactrim, patient overdue for appointment. Scheduled for 7/27.   Sandie Ano, RN

## 2022-03-05 NOTE — Telephone Encounter (Signed)
Message sent to provider to assess for need of Bactrim.    Sandie Ano, RN

## 2022-03-05 NOTE — Telephone Encounter (Signed)
Okay to refill per Dr. Elinor Parkinson.   Sandie Ano, RN

## 2022-03-13 ENCOUNTER — Other Ambulatory Visit (HOSPITAL_COMMUNITY): Payer: Self-pay

## 2022-03-26 ENCOUNTER — Encounter: Payer: Self-pay | Admitting: Infectious Diseases

## 2022-03-26 ENCOUNTER — Other Ambulatory Visit (HOSPITAL_COMMUNITY): Payer: Self-pay

## 2022-03-26 ENCOUNTER — Other Ambulatory Visit: Payer: Self-pay

## 2022-03-26 DIAGNOSIS — Z5181 Encounter for therapeutic drug level monitoring: Secondary | ICD-10-CM | POA: Insufficient documentation

## 2022-03-26 DIAGNOSIS — B20 Human immunodeficiency virus [HIV] disease: Secondary | ICD-10-CM

## 2022-03-26 MED ORDER — SULFAMETHOXAZOLE-TRIMETHOPRIM 400-80 MG PO TABS
1.0000 | ORAL_TABLET | Freq: Every day | ORAL | 0 refills | Status: DC
  Filled 2022-03-26: qty 30, 30d supply, fill #0

## 2022-03-26 NOTE — Progress Notes (Deleted)
[address], [place], [place], [address]                                                                  Phn. [phone]; Fax: [phone]                                                                             Date: 06/24/21  Reason for Visit: HIV follow up    HPI: Jordan Benjamin is a 36 y.o.old male  ( MSM) with a history of HIV, Shingles ( treated), recent Monkey pox s/p tx who is here for a follow up.  06/24/21 Taking Biktarvy every day. Denies missed doses or barriers to adherence of tx. Recently treated for Monkey pox with TPOX in the setting pf proctitis. He tells me his symptoms has resolved and the rashes have also scabbed except one in the left arm which is pink as the scab fell off while taking a shower this morning. Denies being sexually active but had a kiss. Working in Sales executive. Smokes marijuana every day, denies alcohol and using drugs. Refused PCV 23 and Flu vaccine. No other complaints.   ROS: Denies dysphagia, odynophagia, cough, fever, nausea, vomiting, diarrhea, constipation, weight loss, chills, night sweats, recent hospitalizations, rashes, joint complaints, shortness of breath, headaches, chest pain, abdominal pain, dysuria.  Current Outpatient Medications on File Prior to Visit  Medication Sig Dispense Refill   bictegravir-emtricitabine-tenofovir AF (BIKTARVY) 50-200-25 MG TABS tablet Take 1 tablet by mouth daily. 30 tablet 4   cetirizine-pseudoephedrine (ZYRTEC-D) 5-120 MG tablet Take 1 tablet by mouth daily. 30 tablet 0   mupirocin cream (BACTROBAN) 2 % Apply 1 application topically 2 (two) times daily. 15 g 0   sulfamethoxazole-trimethoprim (BACTRIM) 400-80 MG tablet Take 1 tablet by mouth daily. 30 tablet 0   No current facility-administered medications on  file prior to visit.    No Known Allergies  Social History   Socioeconomic History   Marital status: Single    Spouse name: Not on file   Number of children: Not on file   Years of education: Not on file   Highest education level: Not on file  Occupational History   Not on file  Tobacco Use   Smoking status: Every Day   Smokeless tobacco: Former    Quit date: 10/01/2020   Tobacco comments:    1-2 cigarettes a day; quit February 2022  Vaping Use   Vaping Use: Never used  Substance and Sexual Activity   Alcohol use: Yes    Comment: very rarely    Drug use: Yes    Types: Marijuana  Comment: daily    Sexual activity: Not Currently    Partners: Male    Birth control/protection: Condom    Comment: declined condoms 03/2021  Other Topics Concern   Not on file  Social History Narrative   Not on file   Social Determinants of Health   Financial Resource Strain: Not on file  Food Insecurity: Not on file  Transportation Needs: Not on file  Physical Activity: Not on file  Stress: Not on file  Social Connections: Not on file  Intimate Partner Violence: Not on file    Vitals  There were no vitals taken for this visit.  Examination  Gen: Alert and oriented x 3, no acute distress HEENT: [place]/AT, no scleral icterus, no pale conjunctivae, hearing normal, oral mucosa moist,  NO ORAL CANDIDIASIS , DENTAL CARIES  Neck: Supple, no lymphadenopathy Cardio: Regular rate and rhythm Resp: CTAB GI: Soft, nontender, nondistended GU: Musc: Extremities: No cyanosis, clubbing, or edema Skin:healed lesions of monkey pox with one pink area in the left arm  Neuro: No focal deficits Psych: Calm, cooperative  Lab Results HIV 1 RNA Quant  Date Value  04/23/2021 <20 DETECTED copies/mL (A)  02/21/2021 313 copies/mL (H)  02/05/2021 99,200 Copies/mL (H)   CD4 T Cell Abs (/uL)  Date Value  04/23/2021 229 (L)  07/22/2020 176 (L)  04/26/2020 101 (L)   No results found for:  "HIV1GENOSEQ" Lab Results  Component Value Date   WBC 3.9 04/26/2020   HGB 14.4 04/26/2020   HCT 43.8 04/26/2020   MCV 85.2 04/26/2020   PLT 212 04/26/2020    Lab Results  Component Value Date   CREATININE 1.22 02/21/2021   BUN 10 02/21/2021   NA 137 02/21/2021   K 4.6 02/21/2021   CL 104 02/21/2021   CO2 27 02/21/2021   Lab Results  Component Value Date   ALT 12 02/21/2021   AST 20 02/21/2021   BILITOT 0.5 02/21/2021    Lab Results  Component Value Date   CHOL 119 02/21/2021   TRIG 72 02/21/2021   HDL 62 02/21/2021   LDLCALC 42 02/21/2021   Lab Results  Component Value Date   HAV NON-REACTIVE 04/26/2020   Lab Results  Component Value Date   HEPBSAG NON-REACTIVE 04/26/2020   HEPBSAB REACTIVE (A) 04/26/2020   No results found for: "HCVAB" Lab Results  Component Value Date   CHLAMYDIAWP Negative 02/21/2021   N Negative 02/21/2021   No results found for: "GCPROBEAPT" No results found for: "QUANTGOLD"   Health Maintenance: Immunization History  Administered Date(s) Administered   Hepatitis A, Adult 08/16/2020, 04/23/2021   Pneumococcal Conjugate-13 08/16/2020   Rabies, IM 05/19/2018, 05/23/2018   Tdap 05/19/2018   Assessment/Plan: HIV in a MSM Taking Biktarvy, he tells me he has been taking Bactrim every other day as he was not sent bactrim from the pharmacy. Advised to call pharmacy. Continue Bactrim and Biktarvy for now, will plan to DC bactrim if HIV RNA undetectable and CD4 count over 200 today  Meds refilled HIV RNA and CD4 today  Follow up in 4 months for VL  Monkey Pox S/p tx  All areas have scabbed except one in the left arm which is raw as the scab fell off while taking a shower  Advised to cover   STD Screening Urine GC and RPR  Immunization Discussed about recommended vaccines in [facility] Refused vaccines today   Health Maintenance Follows with Dental   I spent 30 minutes with the patient including  greater than 50% of time in face to  face counsel of the patient and in coordination of their care.   Patient's labs were reviewed as well as his previous records. Patients questions were addressed and answered. Safe sex counseling done.   Electronically signed by:  Odette Fraction, MD Infectious Disease Physician [facility] [address] [place], [place] [address] Phone: [phone]  Fax: [phone]

## 2022-04-07 ENCOUNTER — Other Ambulatory Visit (HOSPITAL_COMMUNITY): Payer: Self-pay

## 2022-04-09 ENCOUNTER — Other Ambulatory Visit (HOSPITAL_COMMUNITY): Payer: Self-pay

## 2022-04-13 ENCOUNTER — Other Ambulatory Visit (HOSPITAL_COMMUNITY): Payer: Self-pay

## 2022-04-15 ENCOUNTER — Other Ambulatory Visit (HOSPITAL_COMMUNITY): Payer: Self-pay

## 2022-04-17 ENCOUNTER — Other Ambulatory Visit: Payer: Self-pay | Admitting: Infectious Diseases

## 2022-04-17 ENCOUNTER — Ambulatory Visit (INDEPENDENT_AMBULATORY_CARE_PROVIDER_SITE_OTHER): Payer: 59 | Admitting: Infectious Diseases

## 2022-04-17 ENCOUNTER — Encounter: Payer: Self-pay | Admitting: Infectious Diseases

## 2022-04-17 ENCOUNTER — Other Ambulatory Visit: Payer: Self-pay

## 2022-04-17 ENCOUNTER — Other Ambulatory Visit (HOSPITAL_COMMUNITY)
Admission: RE | Admit: 2022-04-17 | Discharge: 2022-04-17 | Disposition: A | Discharge status: Home / Self Care | Payer: 59 | Source: Ambulatory Visit | Attending: Infectious Diseases | Admitting: Infectious Diseases

## 2022-04-17 ENCOUNTER — Other Ambulatory Visit (HOSPITAL_COMMUNITY): Payer: Self-pay

## 2022-04-17 VITALS — BP 121/82 | HR 62 | Ht 73.0 in | Wt 158.0 lb

## 2022-04-17 DIAGNOSIS — Z113 Encounter for screening for infections with a predominantly sexual mode of transmission: Secondary | ICD-10-CM | POA: Diagnosis present

## 2022-04-17 DIAGNOSIS — B20 Human immunodeficiency virus [HIV] disease: Secondary | ICD-10-CM

## 2022-04-17 DIAGNOSIS — Z7185 Encounter for immunization safety counseling: Secondary | ICD-10-CM

## 2022-04-17 DIAGNOSIS — Z741 Need for assistance with personal care: Secondary | ICD-10-CM | POA: Diagnosis not present

## 2022-04-17 DIAGNOSIS — Z5181 Encounter for therapeutic drug level monitoring: Secondary | ICD-10-CM | POA: Diagnosis not present

## 2022-04-17 MED ORDER — SULFAMETHOXAZOLE-TRIMETHOPRIM 400-80 MG PO TABS
1.0000 | ORAL_TABLET | Freq: Every day | ORAL | 5 refills | Status: DC
  Filled 2022-04-17 – 2022-05-11 (×2): qty 30, 30d supply, fill #0
  Filled 2022-09-28: qty 30, 30d supply, fill #1
  Filled 2022-10-21 – 2023-02-24 (×6): qty 30, 30d supply, fill #2
  Filled 2023-03-19 – 2023-04-01 (×2): qty 30, 30d supply, fill #3

## 2022-04-17 MED ORDER — BICTEGRAVIR-EMTRICITAB-TENOFOV 50-200-25 MG PO TABS
1.0000 | ORAL_TABLET | Freq: Every day | ORAL | 5 refills | Status: DC
  Filled 2022-04-17 – 2022-05-11 (×2): qty 30, 30d supply, fill #0
  Filled 2022-08-10 – 2022-09-28 (×2): qty 30, 30d supply, fill #1
  Filled 2022-10-21 – 2022-11-23 (×3): qty 30, 30d supply, fill #2
  Filled 2022-12-03 – 2023-02-24 (×4): qty 30, 30d supply, fill #3
  Filled 2023-03-19 – 2023-04-01 (×2): qty 30, 30d supply, fill #4

## 2022-04-17 NOTE — Progress Notes (Unsigned)
[address], [place], [place], [address]                                                                  Phn. [phone]; Fax: [phone]                                                                             Date: 04/17/22  Reason for Visit: HIV follow up  HPI: Jordan Benjamin is a 36 y.o.old male  ( MSM) with a history of HIV, Shingles ( treated), Monkey pox s/p tx who is here for a follow up.  Taking Biktarvy /Bactrim mostly, misses 2-3 days at the max in a month. Denies any barriers to adherence of tx. He has missed appointments earlier due to switching of jobs and not having insurance. Advised to let us know if no insurance ever in the future and he can continue for his HIV care even if he loses his insurance. Smokes very rarely when he drinks alcohol. Smokes weed every day. He is with a new male partner and had sexual interaction with him recently and used protection, He would like to be screened for STDs today. Willing to get PCV 20 and fu with Dental. 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 No complaints today   ROS: Denies fevers, chills, sweats. Denies nausea, vomiting, abdominal pain and diarrhea. Denies chest pain, SOB and cough. Denies rashes, GU complaints and joint complaints. Denies headache, blurry vision etc   Current Outpatient Medications on File Prior to Visit  Medication Sig Dispense Refill   bictegravir-emtricitabine-tenofovir AF (BIKTARVY) 50-200-25 MG TABS tablet Take 1 tablet by mouth daily. 30 tablet 4   cetirizine-pseudoephedrine (ZYRTEC-D) 5-120 MG tablet Take 1 tablet by mouth daily. 30 tablet 0   mupirocin cream (BACTROBAN) 2 % Apply 1 application topically 2 (two) times daily. 15 g 0   sulfamethoxazole-trimethoprim (BACTRIM) 400-80 MG tablet Take 1  tablet by mouth daily. 30 tablet 0   No current facility-administered medications on file prior to visit.    No Known Allergies  Social History   Socioeconomic History   Marital status: Single    Spouse name: Not on file   Number of children: Not on file   Years of education: Not on file   Highest education level: Not on file  Occupational History   Not on file  Tobacco Use   Smoking status: Every Day   Smokeless tobacco: Former    Quit date: 10/01/2020   Tobacco comments:    1-2 cigarettes a day; quit February 2022  Vaping Use   Vaping Use: Never  used  Substance and Sexual Activity   Alcohol use: Yes    Comment: very rarely    Drug use: Yes    Types: Marijuana    Comment: daily    Sexual activity: Not Currently    Partners: Male    Birth control/protection: Condom    Comment: declined condoms 03/2021  Other Topics Concern   Not on file  Social History Narrative   Not on file   Social Determinants of Health   Financial Resource Strain: Not on file  Food Insecurity: Not on file  Transportation Needs: Not on file  Physical Activity: Not on file  Stress: Not on file  Social Connections: Not on file  Intimate Partner Violence: Not on file   History reviewed. No pertinent family history.  Vitals  BP 121/82   Pulse 62   Ht 6\' 1"  (1.854 m)   Wt 158 lb (71.7 kg)   BMI 20.85 kg/m    Examination  Gen: Alert and oriented x 3, no acute distress HEENT: [place]/AT, no scleral icterus, no pale conjunctivae, hearing normal, oral mucosa moist,  NO ORAL CANDIDIASIS Neck: Supple Cardio: Regular rate and rhythm Resp: Respiratory effort WNL GI: Soft, nontender, nondistended GU: Musc: Extremities: No pedal edema Skin: no rashes  Neuro: grossly non focal  Psych: Calm, cooperative  Lab Results HIV 1 RNA Quant  Date Value  04/23/2021 <20 DETECTED copies/mL (A)  02/21/2021 313 copies/mL (H)  02/05/2021 99,200 Copies/mL (H)   CD4 T Cell Abs (/uL)  Date Value   04/23/2021 229 (L)  07/22/2020 176 (L)  04/26/2020 101 (L)   No results found for: "HIV1GENOSEQ" Lab Results  Component Value Date   WBC 3.9 04/26/2020   HGB 14.4 04/26/2020   HCT 43.8 04/26/2020   MCV 85.2 04/26/2020   PLT 212 04/26/2020    Lab Results  Component Value Date   CREATININE 1.22 02/21/2021   BUN 10 02/21/2021   NA 137 02/21/2021   K 4.6 02/21/2021   CL 104 02/21/2021   CO2 27 02/21/2021   Lab Results  Component Value Date   ALT 12 02/21/2021   AST 20 02/21/2021   BILITOT 0.5 02/21/2021    Lab Results  Component Value Date   CHOL 119 02/21/2021   TRIG 72 02/21/2021   HDL 62 02/21/2021   LDLCALC 42 02/21/2021   Lab Results  Component Value Date   HAV NON-REACTIVE 04/26/2020   Lab Results  Component Value Date   HEPBSAG NON-REACTIVE 04/26/2020   HEPBSAB REACTIVE (A) 04/26/2020   No results found for: "HCVAB" Lab Results  Component Value Date   CHLAMYDIAWP Negative 02/21/2021   N Negative 02/21/2021   No results found for: "GCPROBEAPT" No results found for: "QUANTGOLD"   Health Maintenance: Immunization History  Administered Date(s) Administered   Hepatitis A, Adult 08/16/2020, 04/23/2021   Pneumococcal Conjugate-13 08/16/2020   Rabies, IM 05/19/2018, 05/23/2018   Tdap 05/19/2018   Problem List Items Addressed This Visit       Other   Screening examination for venereal disease   Relevant Orders   Cytology (oral, anal, urethral) ancillary only   Cytology (oral, anal, urethral) ancillary only   Urine cytology ancillary only   RPR   Immunization counseling   Relevant Orders   Pneumococcal conjugate vaccine 20-valent (Prevnar-20) (Completed)   HIV disease (HCC) - Primary   Relevant Medications   bictegravir-emtricitabine-tenofovir AF (BIKTARVY) 50-200-25 MG TABS tablet   sulfamethoxazole-trimethoprim (BACTRIM) 400-80 MG tablet   Other  Relevant Orders   HIV RNA, RTPCR W/R GT (RTI, PI,INT)   T-helper cells (CD4) count (not at  [facility])   Medication monitoring encounter   Relevant Orders   Comprehensive metabolic panel   Lipid panel   Needs assistance with dental care    Assessment/Plan: # HIV in a MSM Continue Biktarvy and Bactrim for now  Meds refilled Labs today.  Will plan to stop Bactrim if VL consistently undetectable and CD4 more than 200 Fu in 4 months   # STD Screening Urine GC and RPR No acute concerns  # Immunization Counseling  Willing to get PCV 20 today   # Health Maintenance Discussed to fu with Dental   I spent 41 minutes with the patient including greater than 50% of time in face to face counsel of the patient and in coordination of their care.   Patient's labs were reviewed as well as his previous records. Patients questions were addressed and answered. Safe sex counseling done.   Electronically signed by:  Odette Fraction, MD Infectious Disease Physician [facility] [address] [place], [place] [address] Phone: [phone]  Fax: [phone]

## 2022-04-17 NOTE — Telephone Encounter (Signed)
Patient has appointment today 8/18

## 2022-04-20 ENCOUNTER — Other Ambulatory Visit (HOSPITAL_COMMUNITY): Payer: Self-pay

## 2022-04-20 LAB — CYTOLOGY, (ORAL, ANAL, URETHRAL) ANCILLARY ONLY
Chlamydia: NEGATIVE
Chlamydia: NEGATIVE
Comment: NEGATIVE
Comment: NEGATIVE
Comment: NORMAL
Comment: NORMAL
Neisseria Gonorrhea: NEGATIVE
Neisseria Gonorrhea: NEGATIVE

## 2022-04-20 LAB — URINE CYTOLOGY ANCILLARY ONLY
Chlamydia: NEGATIVE
Comment: NEGATIVE
Comment: NORMAL
Neisseria Gonorrhea: NEGATIVE

## 2022-04-22 ENCOUNTER — Other Ambulatory Visit: Payer: Self-pay | Admitting: Pharmacist

## 2022-04-22 DIAGNOSIS — B20 Human immunodeficiency virus [HIV] disease: Secondary | ICD-10-CM

## 2022-04-22 LAB — COMPREHENSIVE METABOLIC PANEL
AG Ratio: 1.7 (calc) (ref 1.0–2.5)
ALT: 15 U/L (ref 9–46)
AST: 19 U/L (ref 10–40)
Albumin: 5 g/dL (ref 3.6–5.1)
Alkaline phosphatase (APISO): 67 U/L (ref 36–130)
BUN/Creatinine Ratio: 9 (calc) (ref 6–22)
BUN: 11 mg/dL (ref 7–25)
CO2: 27 mmol/L (ref 20–32)
Calcium: 10 mg/dL (ref 8.6–10.3)
Chloride: 103 mmol/L (ref 98–110)
Creat: 1.27 mg/dL — ABNORMAL HIGH (ref 0.60–1.26)
Globulin: 2.9 g/dL (calc) (ref 1.9–3.7)
Glucose, Bld: 88 mg/dL (ref 65–99)
Potassium: 4.4 mmol/L (ref 3.5–5.3)
Sodium: 139 mmol/L (ref 135–146)
Total Bilirubin: 0.4 mg/dL (ref 0.2–1.2)
Total Protein: 7.9 g/dL (ref 6.1–8.1)

## 2022-04-22 LAB — LIPID PANEL
Cholesterol: 118 mg/dL (ref <200)
HDL: 67 mg/dL (ref >=40)
LDL Cholesterol (Calc): 32 mg/dL (calc)
Non-HDL Cholesterol (Calc): 51 mg/dL (calc) (ref <130)
Total CHOL/HDL Ratio: 1.8 (calc) (ref <5.0)
Triglycerides: 105 mg/dL (ref <150)

## 2022-04-22 LAB — T-HELPER CELLS (CD4) COUNT (NOT AT ARMC)
Absolute CD4: 387 cells/uL — ABNORMAL LOW (ref 490–1740)
CD4 T Helper %: 16 % — ABNORMAL LOW (ref 30–61)
Total lymphocyte count: 2470 cells/uL (ref 850–3900)

## 2022-04-22 LAB — HIV RNA, RTPCR W/R GT (RTI, PI,INT)
HIV 1 RNA Quant: <20 copies/mL — AB
HIV-1 RNA Quant, Log: <1.3 Log copies/mL — AB

## 2022-04-22 LAB — RPR: RPR Ser Ql: NONREACTIVE

## 2022-04-22 MED ORDER — BICTEGRAVIR-EMTRICITAB-TENOFOV 50-200-25 MG PO TABS: 1.0000 | ORAL_TABLET | Freq: Every day | ORAL | 0 refills | Status: AC

## 2022-04-22 NOTE — Progress Notes (Signed)
Medication Samples have been provided to the patient.  Drug name: Biktarvy        Strength: 50/200/25 mg       Qty: 7 tablets (1 bottles) LOT: [id number]   Exp.Date: 9/25  Dosing instructions: Take one tablet by mouth once daily  The patient has been instructed regarding the correct time, dose, and frequency of taking this medication, including desired effects and most common side effects.   Zaheer Wageman, PharmD, CPP, BCIDP Clinical Pharmacist Practitioner Infectious Diseases Clinical Pharmacist [facility]  

## 2022-05-06 ENCOUNTER — Other Ambulatory Visit (HOSPITAL_COMMUNITY): Payer: Self-pay

## 2022-05-11 ENCOUNTER — Other Ambulatory Visit (HOSPITAL_COMMUNITY): Payer: Self-pay

## 2022-05-12 ENCOUNTER — Other Ambulatory Visit (HOSPITAL_COMMUNITY): Payer: Self-pay

## 2022-05-29 ENCOUNTER — Other Ambulatory Visit (HOSPITAL_COMMUNITY): Payer: Self-pay

## 2022-06-03 ENCOUNTER — Other Ambulatory Visit (HOSPITAL_COMMUNITY): Payer: Self-pay

## 2022-08-10 ENCOUNTER — Other Ambulatory Visit (HOSPITAL_COMMUNITY): Payer: Self-pay

## 2022-08-19 ENCOUNTER — Other Ambulatory Visit (HOSPITAL_COMMUNITY): Payer: Self-pay

## 2022-08-26 ENCOUNTER — Other Ambulatory Visit (HOSPITAL_COMMUNITY): Payer: Self-pay

## 2022-08-28 ENCOUNTER — Other Ambulatory Visit (HOSPITAL_COMMUNITY): Payer: Self-pay

## 2022-09-01 ENCOUNTER — Other Ambulatory Visit (HOSPITAL_COMMUNITY): Payer: Self-pay

## 2022-09-08 ENCOUNTER — Other Ambulatory Visit (HOSPITAL_COMMUNITY): Payer: Self-pay

## 2022-09-09 ENCOUNTER — Telehealth: Payer: Self-pay

## 2022-09-09 NOTE — Telephone Encounter (Signed)
[facility] Patient Advocate Encounter  [facility] and I have been unsuccsessful in reaching patient to be able to refill medication.    We have tried multiple times without a response.

## 2022-09-28 ENCOUNTER — Other Ambulatory Visit (HOSPITAL_COMMUNITY): Payer: Self-pay

## 2022-10-21 ENCOUNTER — Other Ambulatory Visit (HOSPITAL_COMMUNITY): Payer: Self-pay

## 2022-11-13 ENCOUNTER — Other Ambulatory Visit (HOSPITAL_COMMUNITY): Payer: Self-pay

## 2022-11-23 ENCOUNTER — Other Ambulatory Visit: Payer: Self-pay

## 2022-11-23 ENCOUNTER — Other Ambulatory Visit (HOSPITAL_COMMUNITY): Payer: Self-pay

## 2022-11-24 ENCOUNTER — Other Ambulatory Visit: Payer: Self-pay

## 2022-11-27 ENCOUNTER — Other Ambulatory Visit: Payer: Self-pay

## 2022-12-03 ENCOUNTER — Other Ambulatory Visit (HOSPITAL_COMMUNITY): Payer: Self-pay

## 2022-12-07 ENCOUNTER — Other Ambulatory Visit: Payer: Self-pay | Admitting: Pharmacist

## 2022-12-07 DIAGNOSIS — B20 Human immunodeficiency virus [HIV] disease: Secondary | ICD-10-CM

## 2022-12-07 MED ORDER — BIKTARVY 50-200-25 MG PO TABS: 1.0000 | ORAL_TABLET | Freq: Every day | ORAL | 0 refills | Status: AC

## 2022-12-07 NOTE — Progress Notes (Signed)
Medication Samples have been provided to the patient.  Drug name: Biktarvy        Strength: 50/200/25 mg       Qty: 21 tablets (3 bottles)   LOT: [id number]   Exp.Date: 03/2025  Dosing instructions: Take one tablet by mouth once daily  The patient has been instructed regarding the correct time, dose, and frequency of taking this medication, including desired effects and most common side effects.   Yaden Seith L. Jannette Fogo, PharmD, BCIDP, AAHIVP, CPP Clinical Pharmacist Practitioner Infectious Diseases Clinical Pharmacist [facility] 08/12/2020, 10:07 AM

## 2022-12-11 ENCOUNTER — Other Ambulatory Visit (HOSPITAL_COMMUNITY): Payer: Self-pay

## 2022-12-14 ENCOUNTER — Other Ambulatory Visit (HOSPITAL_COMMUNITY): Payer: Self-pay

## 2022-12-15 ENCOUNTER — Other Ambulatory Visit: Payer: Self-pay

## 2022-12-16 ENCOUNTER — Other Ambulatory Visit: Payer: Self-pay

## 2022-12-24 ENCOUNTER — Other Ambulatory Visit (HOSPITAL_COMMUNITY): Payer: Self-pay

## 2023-01-14 ENCOUNTER — Other Ambulatory Visit (HOSPITAL_COMMUNITY): Payer: Self-pay

## 2023-01-15 NOTE — Progress Notes (Signed)
This encounter was created in error - please disregard.

## 2023-01-20 ENCOUNTER — Other Ambulatory Visit: Payer: Self-pay

## 2023-01-20 ENCOUNTER — Other Ambulatory Visit (HOSPITAL_COMMUNITY): Payer: Self-pay

## 2023-01-28 ENCOUNTER — Other Ambulatory Visit (HOSPITAL_COMMUNITY): Payer: Self-pay

## 2023-01-28 ENCOUNTER — Other Ambulatory Visit: Payer: Self-pay

## 2023-02-11 ENCOUNTER — Other Ambulatory Visit (HOSPITAL_COMMUNITY): Payer: Self-pay

## 2023-02-15 ENCOUNTER — Other Ambulatory Visit (HOSPITAL_COMMUNITY): Payer: Self-pay

## 2023-02-17 ENCOUNTER — Encounter (HOSPITAL_COMMUNITY): Payer: Self-pay

## 2023-02-17 ENCOUNTER — Other Ambulatory Visit (HOSPITAL_COMMUNITY): Payer: Self-pay

## 2023-02-23 ENCOUNTER — Other Ambulatory Visit (HOSPITAL_COMMUNITY): Payer: Self-pay

## 2023-02-24 ENCOUNTER — Other Ambulatory Visit (HOSPITAL_COMMUNITY): Payer: Self-pay

## 2023-03-17 ENCOUNTER — Other Ambulatory Visit (HOSPITAL_COMMUNITY): Payer: Self-pay

## 2023-03-19 ENCOUNTER — Other Ambulatory Visit (HOSPITAL_COMMUNITY): Payer: Self-pay

## 2023-03-29 ENCOUNTER — Other Ambulatory Visit (HOSPITAL_COMMUNITY): Payer: Self-pay

## 2023-04-01 ENCOUNTER — Other Ambulatory Visit (HOSPITAL_COMMUNITY): Payer: Self-pay

## 2023-04-01 ENCOUNTER — Other Ambulatory Visit: Payer: Self-pay

## 2023-04-21 ENCOUNTER — Other Ambulatory Visit: Payer: Self-pay

## 2023-04-21 ENCOUNTER — Other Ambulatory Visit: Payer: Self-pay | Admitting: Infectious Diseases

## 2023-04-21 ENCOUNTER — Other Ambulatory Visit (HOSPITAL_COMMUNITY): Payer: Self-pay

## 2023-04-21 DIAGNOSIS — B20 Human immunodeficiency virus [HIV] disease: Secondary | ICD-10-CM

## 2023-04-21 MED ORDER — SULFAMETHOXAZOLE-TRIMETHOPRIM 400-80 MG PO TABS
1.0000 | ORAL_TABLET | Freq: Every day | ORAL | 0 refills | Status: DC
Start: 2023-04-21 — End: 2023-06-08
  Filled 2023-04-21: qty 30, 30d supply, fill #0

## 2023-04-21 MED ORDER — BIKTARVY 50-200-25 MG PO TABS
1.0000 | ORAL_TABLET | Freq: Every day | ORAL | 0 refills | Status: DC
  Filled 2023-04-21 – 2023-05-25 (×2): qty 30, 30d supply, fill #0

## 2023-04-26 ENCOUNTER — Other Ambulatory Visit (HOSPITAL_COMMUNITY): Payer: Self-pay

## 2023-05-17 ENCOUNTER — Other Ambulatory Visit (HOSPITAL_COMMUNITY): Payer: Self-pay

## 2023-05-17 ENCOUNTER — Other Ambulatory Visit: Payer: Self-pay

## 2023-05-19 ENCOUNTER — Other Ambulatory Visit (HOSPITAL_COMMUNITY): Payer: Self-pay

## 2023-05-21 ENCOUNTER — Encounter (HOSPITAL_COMMUNITY): Payer: Self-pay

## 2023-05-21 ENCOUNTER — Other Ambulatory Visit (HOSPITAL_COMMUNITY): Payer: Self-pay

## 2023-05-25 ENCOUNTER — Other Ambulatory Visit (HOSPITAL_COMMUNITY): Payer: Self-pay

## 2023-05-25 ENCOUNTER — Other Ambulatory Visit: Payer: Self-pay

## 2023-05-25 NOTE — Progress Notes (Signed)
Specialty Pharmacy Refill Coordination Note  Jordan Benjamin is a 37 y.o. male contacted today regarding refills of specialty medication(s) Bictegravir-Emtricitab-Tenofov .  Patient requested Jordan Benjamin at [facility] [place]  on 05/25/23   Medication will be filled on 05/25/23.

## 2023-06-08 ENCOUNTER — Other Ambulatory Visit (HOSPITAL_COMMUNITY)
Admission: RE | Admit: 2023-06-08 | Discharge: 2023-06-08 | Disposition: A | Discharge status: Home / Self Care | Payer: 59 | Source: Ambulatory Visit | Attending: Infectious Diseases | Admitting: Infectious Diseases

## 2023-06-08 ENCOUNTER — Other Ambulatory Visit: Payer: Self-pay

## 2023-06-08 ENCOUNTER — Encounter: Payer: Self-pay | Admitting: Infectious Diseases

## 2023-06-08 ENCOUNTER — Ambulatory Visit (INDEPENDENT_AMBULATORY_CARE_PROVIDER_SITE_OTHER): Payer: 59 | Admitting: Infectious Diseases

## 2023-06-08 VITALS — BP 135/82 | HR 84 | Resp 16 | Ht 73.0 in | Wt 153.0 lb

## 2023-06-08 DIAGNOSIS — Z0000 Encounter for general adult medical examination without abnormal findings: Secondary | ICD-10-CM | POA: Diagnosis not present

## 2023-06-08 DIAGNOSIS — Z7185 Encounter for immunization safety counseling: Secondary | ICD-10-CM

## 2023-06-08 DIAGNOSIS — Z5181 Encounter for therapeutic drug level monitoring: Secondary | ICD-10-CM | POA: Diagnosis not present

## 2023-06-08 DIAGNOSIS — B20 Human immunodeficiency virus [HIV] disease: Secondary | ICD-10-CM | POA: Diagnosis not present

## 2023-06-08 DIAGNOSIS — Z113 Encounter for screening for infections with a predominantly sexual mode of transmission: Secondary | ICD-10-CM

## 2023-06-08 MED ORDER — BIKTARVY 50-200-25 MG PO TABS
1.0000 | ORAL_TABLET | Freq: Every day | ORAL | 5 refills | Status: DC
  Filled 2023-06-08 – 2023-09-02 (×4): qty 30, 30d supply, fill #0

## 2023-06-08 NOTE — Progress Notes (Signed)
[address], [place], [place], [address]                                                                  Phn. [phone]; Fax: [phone]                                                                             Date: 06/08/23  Reason for Visit: HIV follow up  HPI: Jordan Benjamin is a 37 y.o.old male  ( MSM) with a history of HIV, Shingles ( treated), Monkey pox s/p tx who is here for a follow up.  10/8 Taking biktarvy except when he had a gap for 3 weeks almost 3 months ago due to a change in insurance. Since then, he has been compliant with his regimen. Stopped taking bactrim. He denies current sexual activity, no consistent sexual partner.  Declined STD screening at his anal canal as well as anal pap as he has to poop. He has significantly reduced his smoking to 'maybe two cigarettes a day' from a pack a day. He drinks alcohol occasionally, 'every holiday'. Reports he has a new job at [employer] and has already got Flu and covid as part of employment screening.   Repots he self-discovered possible testicular cyst in left side while he was shaving almost 2 months ago. He had a US done by PCP and results are expected tomorrow. Denies any tenderness, discharge or wounds. Denies fevers, chills. Denies nausea, vomiting and diarrhea. He has no other complaints.   ROS: Denies fevers, chills, sweats. Denies nausea, vomiting, abdominal pain and diarrhea. Denies chest pain, SOB and cough. Denies rashes and joint complaints. Denies headache, blurry vision etc   Current Outpatient Medications on File Prior to Visit  Medication Sig Dispense Refill   cetirizine-pseudoephedrine (ZYRTEC-D) 5-120 MG tablet Take 1 tablet by mouth daily. (Patient not taking: Reported on 06/08/2023) 30 tablet 0   mupirocin cream  (BACTROBAN) 2 % Apply 1 application topically 2 (two) times daily. (Patient not taking: Reported on 06/08/2023) 15 g 0   No current facility-administered medications on file prior to visit.   No Known Allergies  Social History   Socioeconomic History   Marital status: Single    Spouse name: Not on file   Number of children: Not on file   Years of education: Not on file   Highest education level: Not on file  Occupational History   Not on file  Tobacco Use   Smoking status: Every Day   Smokeless tobacco: Former    Quit date: 10/01/2020   Tobacco comments:    1-2 cigarettes a day; quit February  2022  Vaping Use   Vaping status: Never Used  Substance and Sexual Activity   Alcohol use: Yes    Comment: very rarely    Drug use: Yes    Types: Marijuana    Comment: daily    Sexual activity: Not Currently    Partners: Male    Birth control/protection: Condom    Comment: declined condoms 03/2021  Other Topics Concern   Not on file  Social History Narrative   Not on file   Social Determinants of Health   Financial Resource Strain: Not on file  Food Insecurity: Not on file  Transportation Needs: Not on file  Physical Activity: Not on file  Stress: Not on file  Social Connections: Not on file  Intimate Partner Violence: Not on file   No family history on file.  Vitals  BP 135/82   Pulse 84   Resp 16   Ht 6\' 1"  (1.854 m)   Wt 153 lb (69.4 kg)   SpO2 98%   BMI 20.19 kg/m   Examination  Gen: Alert and oriented x 3, no acute distress HEENT: [place]/AT, no scleral icterus, no pale conjunctivae, hearing normal, oral mucosa moist,  no thrush Neck: Supple Cardio: Regular rate and rhythm Resp: Respiratory effort WNL GI: nondistended GU: Musc: Extremities: No pedal edema Skin: no rashes  Neuro: grossly non focal  Psych: Calm, cooperative  Lab Results HIV 1 RNA Quant (copies/mL)  Date Value  04/17/2022 <20 DETECTED (A)  04/23/2021 <20 DETECTED (A)  02/21/2021 313 (H)    CD4 T Cell Abs (/uL)  Date Value  04/23/2021 229 (L)  07/22/2020 176 (L)  04/26/2020 101 (L)   No results found for: "HIV1GENOSEQ" Lab Results  Component Value Date   WBC 3.9 04/26/2020   HGB 14.4 04/26/2020   HCT 43.8 04/26/2020   MCV 85.2 04/26/2020   PLT 212 04/26/2020    Lab Results  Component Value Date   CREATININE 1.27 (H) 04/17/2022   BUN 11 04/17/2022   NA 139 04/17/2022   K 4.4 04/17/2022   CL 103 04/17/2022   CO2 27 04/17/2022   Lab Results  Component Value Date   ALT 15 04/17/2022   AST 19 04/17/2022   BILITOT 0.4 04/17/2022    Lab Results  Component Value Date   CHOL 118 04/17/2022   TRIG 105 04/17/2022   HDL 67 04/17/2022   LDLCALC 32 04/17/2022   Lab Results  Component Value Date   HAV NON-REACTIVE 04/26/2020   Lab Results  Component Value Date   HEPBSAG NON-REACTIVE 04/26/2020   HEPBSAB REACTIVE (A) 04/26/2020   No results found for: "HCVAB" Lab Results  Component Value Date   CHLAMYDIAWP Negative 04/17/2022   CHLAMYDIAWP Negative 04/17/2022   CHLAMYDIAWP Negative 04/17/2022   N Negative 04/17/2022   N Negative 04/17/2022   N Negative 04/17/2022   No results found for: "GCPROBEAPT" No results found for: "QUANTGOLD"   Health Maintenance: Immunization History  Administered Date(s) Administered   Hepatitis A, Adult 08/16/2020, 04/23/2021   PNEUMOCOCCAL CONJUGATE-20 04/17/2022   Pneumococcal Conjugate-13 08/16/2020   Rabies, IM 05/19/2018, 05/23/2018   Tdap 05/19/2018    Assessment/Plan: # HIV in a MSM - Continue Biktarvy. Meds refilled - Labs today - Fu in 5 months   # ? Testicular cyst - no signs of testicular torsion, epididymo-orchitis  - US done done by PCP with results pending - May need referral to Urology pending US findings.   # STD Screening -  Urine GC and RPR  # Health Maintenance - discussed about Flu and COVID vaccine which he has received already  - Follows with dentist  - Lipid panel today   I  spent 43 minutes with the patient including greater than 50% of time in face to face counsel of the patient and in coordination of their care.   Patient's labs were reviewed as well as his previous records. Patients questions were addressed and answered. Safe sex counseling done.   Electronically signed by:  Odette Fraction, MD Infectious Disease Physician [facility] [address] [place], [place] [address] Phone: [phone]  Fax: [phone]

## 2023-06-09 ENCOUNTER — Encounter: Payer: Self-pay | Admitting: Infectious Diseases

## 2023-06-09 LAB — URINE CYTOLOGY ANCILLARY ONLY
Chlamydia: NEGATIVE
Comment: NEGATIVE
Comment: NORMAL
Neisseria Gonorrhea: NEGATIVE

## 2023-06-09 LAB — T-HELPER CELLS (CD4) COUNT (NOT AT ARMC)
CD4 % Helper T Cell: 16 % — ABNORMAL LOW (ref 33–65)
CD4 T Cell Abs: 295 /uL — ABNORMAL LOW (ref 400–1790)

## 2023-06-09 LAB — CYTOLOGY, (ORAL, ANAL, URETHRAL) ANCILLARY ONLY
Chlamydia: NEGATIVE
Comment: NEGATIVE
Comment: NORMAL
Neisseria Gonorrhea: NEGATIVE

## 2023-06-10 ENCOUNTER — Other Ambulatory Visit: Payer: Self-pay

## 2023-06-10 ENCOUNTER — Other Ambulatory Visit (HOSPITAL_COMMUNITY): Payer: Self-pay

## 2023-06-10 NOTE — Progress Notes (Signed)
Specialty Pharmacy Refill Coordination Note  Jordan Benjamin is a 37 y.o. male contacted today regarding refills of specialty medication(s) Bictegravir-Emtricitab-Tenofov   Patient requested Jordan Benjamin at [facility] [place] date: 06/22/23 (Pending insurance/Copay Card/Clinic Samples)  Patient started new full time job and anticipates prescription benefit coverage starting on 07/02/23. Patient has enough medication through October 24th. Will touch base with patient closer to refill date regarding medication options.   Medication will be filled on 06/21/23.

## 2023-06-10 NOTE — Progress Notes (Signed)
Specialty Pharmacy Ongoing Clinical Assessment Note  Jordan Benjamin is a 37 y.o. male who is being followed by the specialty pharmacy service for RxSp HIV   Patient's specialty medication(s) reviewed today: Bictegravir-Emtricitab-Tenofov   Missed doses in the last 4 weeks: 0   Patient/Caregiver did not have any additional questions or concerns.   Therapeutic benefit summary: Patient is achieving benefit   Adverse events/side effects summary: No adverse events/side effects   Patient's therapy is appropriate to: Continue    Goals Addressed             This Visit's Progress    Achieve Undetectable HIV Viral Load < 20       Patient is on track. Patient will maintain adherence         Follow up:  6 months  Bobette Mo Specialty Pharmacist

## 2023-06-11 LAB — COMPREHENSIVE METABOLIC PANEL
AG Ratio: 1.6 (calc) (ref 1.0–2.5)
ALT: 15 U/L (ref 9–46)
AST: 18 U/L (ref 10–40)
Albumin: 5 g/dL (ref 3.6–5.1)
Alkaline phosphatase (APISO): 68 U/L (ref 36–130)
BUN: 10 mg/dL (ref 7–25)
CO2: 28 mmol/L (ref 20–32)
Calcium: 9.8 mg/dL (ref 8.6–10.3)
Chloride: 102 mmol/L (ref 98–110)
Creat: 1.19 mg/dL (ref 0.60–1.26)
Globulin: 3.2 g/dL (ref 1.9–3.7)
Glucose, Bld: 96 mg/dL (ref 65–99)
Potassium: 3.9 mmol/L (ref 3.5–5.3)
Sodium: 138 mmol/L (ref 135–146)
Total Bilirubin: 0.7 mg/dL (ref 0.2–1.2)
Total Protein: 8.2 g/dL — ABNORMAL HIGH (ref 6.1–8.1)

## 2023-06-11 LAB — CBC
HCT: 45.6 % (ref 38.5–50.0)
Hemoglobin: 14.9 g/dL (ref 13.2–17.1)
MCH: 29 pg (ref 27.0–33.0)
MCHC: 32.7 g/dL (ref 32.0–36.0)
MCV: 88.9 fL (ref 80.0–100.0)
MPV: 10.6 fL (ref 7.5–12.5)
Platelets: 258 10*3/uL (ref 140–400)
RBC: 5.13 10*6/uL (ref 4.20–5.80)
RDW: 12 % (ref 11.0–15.0)
WBC: 4.4 10*3/uL (ref 3.8–10.8)

## 2023-06-11 LAB — HIV RNA, RTPCR W/R GT (RTI, PI,INT)
HIV 1 RNA Quant: NOT DETECTED {copies}/mL
HIV-1 RNA Quant, Log: NOT DETECTED {Log}

## 2023-06-11 LAB — LIPID PANEL
Cholesterol: 128 mg/dL (ref <200)
HDL: 69 mg/dL (ref >=40)
LDL Cholesterol (Calc): 43 mg/dL
Non-HDL Cholesterol (Calc): 59 mg/dL (ref <130)
Total CHOL/HDL Ratio: 1.9 (calc) (ref <5.0)
Triglycerides: 82 mg/dL (ref <150)

## 2023-06-11 LAB — RPR: RPR Ser Ql: NONREACTIVE

## 2023-06-18 NOTE — Addendum Note (Signed)
Addended by: Odette Fraction on: 06/18/2023 10:02 AM   Modules accepted: Level of Service

## 2023-06-21 ENCOUNTER — Other Ambulatory Visit (HOSPITAL_COMMUNITY): Payer: Self-pay

## 2023-06-22 ENCOUNTER — Other Ambulatory Visit: Payer: Self-pay

## 2023-06-22 ENCOUNTER — Other Ambulatory Visit (HOSPITAL_COMMUNITY): Payer: Self-pay

## 2023-06-22 NOTE — Progress Notes (Signed)
Called and spoke with patient regarding Biktarvy refill. Pt new insurance effective 07/02/23. Pt may pick up Biktarvy samples from [facility] clinic to hold him over until then. Will arrange for pick up 11/1 evening or 11/2. Will notify pt if any issues with new insurance at that time. Pt expressed understanding.

## 2023-07-12 ENCOUNTER — Other Ambulatory Visit: Payer: Self-pay

## 2023-07-13 ENCOUNTER — Other Ambulatory Visit: Payer: Self-pay

## 2023-07-14 ENCOUNTER — Other Ambulatory Visit: Payer: Self-pay

## 2023-07-14 NOTE — Progress Notes (Signed)
Unable to find new insurance through Eligibility Check. Called patient and was unable to leave a voicemail. If patient calls back, will have them refer to [facility] clinic for samples until insurance is active again.

## 2023-08-02 ENCOUNTER — Other Ambulatory Visit (HOSPITAL_COMMUNITY): Payer: Self-pay

## 2023-08-06 ENCOUNTER — Other Ambulatory Visit (HOSPITAL_COMMUNITY): Payer: Self-pay

## 2023-08-09 ENCOUNTER — Other Ambulatory Visit: Payer: Self-pay

## 2023-08-12 ENCOUNTER — Other Ambulatory Visit (HOSPITAL_COMMUNITY): Payer: Self-pay

## 2023-08-17 ENCOUNTER — Other Ambulatory Visit: Payer: Self-pay

## 2023-08-17 ENCOUNTER — Other Ambulatory Visit (HOSPITAL_COMMUNITY): Payer: Self-pay

## 2023-08-26 ENCOUNTER — Other Ambulatory Visit (HOSPITAL_COMMUNITY): Payer: Self-pay

## 2023-09-02 ENCOUNTER — Other Ambulatory Visit: Payer: Self-pay

## 2023-09-02 ENCOUNTER — Other Ambulatory Visit (HOSPITAL_COMMUNITY): Payer: Self-pay

## 2023-09-02 NOTE — Progress Notes (Signed)
 Specialty Pharmacy Refill Coordination Note  Jordan Benjamin is a 38 y.o. male contacted today regarding refills of specialty medication(s) Bictegravir-Emtricitab-Tenofov (Biktarvy )   Patient requested Marylyn at [facility] [place] date: 09/06/23   Medication will be filled on 09/02/23.   Patient is aware that medication is pending a PA with insurance, patient will be notified once PA is approved.

## 2023-09-03 ENCOUNTER — Other Ambulatory Visit (HOSPITAL_COMMUNITY): Payer: Self-pay

## 2023-09-08 ENCOUNTER — Other Ambulatory Visit: Payer: Self-pay | Admitting: Pharmacist

## 2023-09-08 DIAGNOSIS — B20 Human immunodeficiency virus [HIV] disease: Secondary | ICD-10-CM

## 2023-09-08 MED ORDER — BIKTARVY 50-200-25 MG PO TABS
1.0000 | ORAL_TABLET | Freq: Every day | ORAL | Status: AC
Start: 2023-09-03 — End: 2023-10-01

## 2023-09-08 NOTE — Progress Notes (Signed)
 Medication Samples have been provided to the patient.  Drug name: Biktarvy        Strength: 50/200/25 mg       Qty: 4 bottles (28 tablets)   LOT: [id number]   Exp.Date: 05/2025  Dosing instructions: Take one tablet by mouth once daily  The patient has been instructed regarding the correct time, dose, and frequency of taking this medication, including desired effects and most common side effects.   Yoshiharu Brassell L. Jannette Fogo, PharmD, BCIDP, AAHIVP, CPP Clinical Pharmacist Practitioner Infectious Diseases Clinical Pharmacist [facility] 08/12/2020, 10:07 AM

## 2023-09-09 ENCOUNTER — Other Ambulatory Visit: Payer: Self-pay

## 2023-09-09 ENCOUNTER — Other Ambulatory Visit: Payer: Self-pay | Admitting: Pharmacist

## 2023-09-09 ENCOUNTER — Other Ambulatory Visit (HOSPITAL_COMMUNITY): Payer: Self-pay

## 2023-09-09 DIAGNOSIS — B20 Human immunodeficiency virus [HIV] disease: Secondary | ICD-10-CM

## 2023-09-09 MED ORDER — BIKTARVY 50-200-25 MG PO TABS
1.0000 | ORAL_TABLET | Freq: Every day | ORAL | 5 refills | Status: DC
Start: 2023-09-09 — End: 2024-03-14

## 2023-09-09 NOTE — Progress Notes (Signed)
 Patient new insurance requires he use [facility]. Marchelle Folks will resend Rx there instead. Disenrolled patient.

## 2023-09-09 NOTE — Progress Notes (Signed)
 Patient now [employer] employee and needs to fill through [facility].  Margarite Gouge, PharmD, CPP, BCIDP, AAHIVP Clinical Pharmacist Practitioner Infectious Diseases Clinical Pharmacist [facility] Infect[facility]

## 2023-09-10 ENCOUNTER — Other Ambulatory Visit: Payer: Self-pay

## 2023-09-10 ENCOUNTER — Other Ambulatory Visit (HOSPITAL_COMMUNITY): Payer: Self-pay

## 2023-11-10 ENCOUNTER — Ambulatory Visit: Payer: Self-pay | Admitting: Infectious Diseases

## 2024-03-14 ENCOUNTER — Other Ambulatory Visit: Payer: Self-pay

## 2024-03-14 ENCOUNTER — Other Ambulatory Visit (HOSPITAL_COMMUNITY): Payer: Self-pay

## 2024-03-14 ENCOUNTER — Ambulatory Visit (INDEPENDENT_AMBULATORY_CARE_PROVIDER_SITE_OTHER): Payer: PRIVATE HEALTH INSURANCE | Admitting: Infectious Diseases

## 2024-03-14 VITALS — BP 112/75 | HR 80 | Temp 98.1°F | Ht 73.0 in | Wt 163.0 lb

## 2024-03-14 DIAGNOSIS — R220 Localized swelling, mass and lump, head: Secondary | ICD-10-CM | POA: Insufficient documentation

## 2024-03-14 DIAGNOSIS — Z79899 Other long term (current) drug therapy: Secondary | ICD-10-CM | POA: Diagnosis not present

## 2024-03-14 DIAGNOSIS — Z113 Encounter for screening for infections with a predominantly sexual mode of transmission: Secondary | ICD-10-CM | POA: Diagnosis not present

## 2024-03-14 DIAGNOSIS — Z7185 Encounter for immunization safety counseling: Secondary | ICD-10-CM

## 2024-03-14 DIAGNOSIS — B20 Human immunodeficiency virus [HIV] disease: Secondary | ICD-10-CM | POA: Diagnosis not present

## 2024-03-14 DIAGNOSIS — Z23 Encounter for immunization: Secondary | ICD-10-CM | POA: Diagnosis not present

## 2024-03-14 DIAGNOSIS — Z0000 Encounter for general adult medical examination without abnormal findings: Secondary | ICD-10-CM

## 2024-03-14 MED ORDER — BIKTARVY 50-200-25 MG PO TABS
1.0000 | ORAL_TABLET | Freq: Every day | ORAL | 11 refills | Status: AC
Start: 2024-03-14 — End: ?

## 2024-03-14 MED ORDER — SULFAMETHOXAZOLE-TRIMETHOPRIM 800-160 MG PO TABS: 1.0000 | ORAL_TABLET | Freq: Two times a day (BID) | ORAL | 0 refills | Status: AC

## 2024-03-14 NOTE — Progress Notes (Unsigned)
 [name], [place], [place], [address]                                                                  Phn. [phone]; Fax: [phone]                                                                             Date: 03/14/24  Reason for Visit: HIV follow up  HPI: Jordan Benjamin is a 38 y.o.old male  ( MSM) with a history of HIV, Shingles ( treated), Monkey pox s/p tx who is here for a follow up.  7/17 Reports compliance with Biktarvy  without missing doses or concerns. 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 He is hesitant for doing blood work today as reports he had a copay of around 3K last visit. He is also concerned about a cut in his left side upper lip by barber on last Thursday. He started having some swelling and discomfort the following day and started taking PO bactrim  which he had some at home. Denies any significant pain, discharge or ulcers inside oral cavity. It has been improving and starting to scab. Declined anal pap as well as std sceening and other non urgent labs until he is able to clarify with his insurance regarding copays. Willing to get HPV #1. No other complaints.   ROS: Denies fevers, chills, sweats. Denies nausea, vomiting, abdominal pain and diarrhea. Denies chest pain, SOB and cough. Denies rashes and joint complaints. Denies headache, blurry vision etc   No current outpatient medications on file prior to visit.   No current facility-administered medications on file prior to visit.   No Known Allergies  Social History   Socioeconomic History   Marital status: Single    Spouse name: Not on file   Number of children: Not on file   Years of education: Not on file   Highest education level: Not on file  Occupational History   Not on file  Tobacco  Use   Smoking status: Every Day   Smokeless tobacco: Former    Quit date: 10/01/2020   Tobacco comments:    1-2 cigarettes a day; quit February 2022  Vaping Use   Vaping status: Never Used  Substance and Sexual Activity   Alcohol use: Yes    Comment: very rarely    Drug use: Yes    Types: Marijuana    Comment: daily    Sexual activity: Not Currently    Partners: Male    Birth control/protection: Condom    Comment: declined condoms 03/2021  Other Topics Concern   Not on file  Social History Narrative   Not on file   Social Drivers of Health   Financial Resource Strain: Not on file  Food Insecurity: Not on file  Transportation Needs: Not on file  Physical Activity: Not on file  Stress: Not on file  Social Connections: Not on file  Intimate Partner Violence: Not on file   No family history on file.  Vitals  BP 112/75   Pulse 80   Temp 98.1 F (36.7 C) (Temporal)   Ht 6' 1 (1.854 m)   Wt 163 lb (73.9 kg)   SpO2 98%   BMI 21.51 kg/m   Examination  Gen: Alert and oriented x 3, no acute distress HEENT: [place]/AT, no scleral icterus, no pale conjunctivae, hearing normal, oral mucosa moist,  no thrush Neck: Supple Cardio: Regular rate and rhythm Resp: Respiratory effort WNL GI: nondistended GU: Musc: Extremities: No pedal edema Skin: Left upper lip with healing injury, no ulcers, blisters in the oral cavity or lips Neuro: grossly non focal  Psych: Calm, cooperative  Lab Results HIV 1 RNA Quant (copies/mL)  Date Value  06/08/2023 NOT DETECTED  04/17/2022 <20 DETECTED (A)  04/23/2021 <20 DETECTED (A)   CD4 T Cell Abs (/uL)  Date Value  06/08/2023 295 (L)  04/23/2021 229 (L)  07/22/2020 176 (L)   No results found for: HIV1GENOSEQ Lab Results  Component Value Date   WBC 4.4 06/08/2023   HGB 14.9 06/08/2023   HCT 45.6 06/08/2023   MCV 88.9 06/08/2023   PLT 258 06/08/2023    Lab Results  Component Value Date   CREATININE 1.19 06/08/2023   BUN 10  06/08/2023   NA 138 06/08/2023   K 3.9 06/08/2023   CL 102 06/08/2023   CO2 28 06/08/2023   Lab Results  Component Value Date   ALT 15 06/08/2023   AST 18 06/08/2023   BILITOT 0.7 06/08/2023    Lab Results  Component Value Date   CHOL 128 06/08/2023   TRIG 82 06/08/2023   HDL 69 06/08/2023   LDLCALC 43 06/08/2023   Lab Results  Component Value Date   HAV NON-REACTIVE 04/26/2020   Lab Results  Component Value Date   HEPBSAG NON-REACTIVE 04/26/2020   HEPBSAB REACTIVE (A) 04/26/2020   No results found for: HCVAB Lab Results  Component Value Date   CHLAMYDIAWP Negative 06/08/2023   CHLAMYDIAWP Negative 06/08/2023   N Negative 06/08/2023   N Negative 06/08/2023   No results found for: GCPROBEAPT No results found for: QUANTGOLD   Health Maintenance: Immunization History  Administered Date(s) Administered   Hepatitis A, Adult 08/16/2020, 04/23/2021   Influenza-Unspecified 04/01/2023   PNEUMOCOCCAL CONJUGATE-20 04/17/2022   Pfizer(Comirnaty)Fall Seasonal Vaccine 12 years and older 06/01/2023, 06/01/2023   Pneumococcal Conjugate-13 08/16/2020   Rabies, IM 05/19/2018, 05/23/2018   Tdap 05/19/2018    Assessment/Plan: # HIV in a MSM - Continue Biktarvy . Meds refilled - Labs today. Deferred non urgent labs today due to concerns with copay. He will call his insurance and figure out the payment first. Discussed possibility of may need to transfer care at [facility] if covered by his insurance  - Fu in 5 months  # Left upper lip swelling/injury - Bactrim  DS 1tab po bid for 3 more days  # ? Testicular cyst - no concerns   # STD Screening - Deferred today due to copay concerns   # Immunization  - HPV# 1 today  # Health Maintenance - Follows with dentist  - Deferred lipid panel  I spent 28 minutes for face to face as well as non-face-to-face activities during the day of encounter including review of prior notes, review of labs, medically appropriate examination  and evaluation, ordering labs and medications, communicating with staff regarding his insurance coverage, documenting notes in the EMR, counseling of the patient and coordination of care.    Patient's labs were reviewed as well as his previous records. Patients questions were addressed and answered. Safe sex counseling done.   Electronically signed by:  Annalee Orem, MD Infectious Disease Physician [facility] for Infectious Disease [address] [place], [place] [address] Phone: [phone]  Fax: [phone]

## 2024-03-15 DIAGNOSIS — Z23 Encounter for immunization: Secondary | ICD-10-CM | POA: Insufficient documentation

## 2024-03-15 LAB — T-HELPER CELLS (CD4) COUNT (NOT AT ARMC)
CD4 % Helper T Cell: 19 % — ABNORMAL LOW (ref 33–65)
CD4 T Cell Abs: 326 /uL — ABNORMAL LOW (ref 400–1790)

## 2024-03-16 ENCOUNTER — Encounter: Payer: Self-pay | Admitting: Infectious Diseases

## 2024-03-17 ENCOUNTER — Other Ambulatory Visit: Payer: Self-pay

## 2024-03-17 ENCOUNTER — Ambulatory Visit (INDEPENDENT_AMBULATORY_CARE_PROVIDER_SITE_OTHER): Payer: PRIVATE HEALTH INSURANCE | Admitting: Infectious Diseases

## 2024-03-17 ENCOUNTER — Encounter: Payer: Self-pay | Admitting: Infectious Diseases

## 2024-03-17 ENCOUNTER — Other Ambulatory Visit: Payer: Self-pay | Admitting: Infectious Diseases

## 2024-03-17 ENCOUNTER — Ambulatory Visit: Payer: Self-pay | Admitting: Infectious Diseases

## 2024-03-17 ENCOUNTER — Other Ambulatory Visit (HOSPITAL_COMMUNITY): Payer: Self-pay

## 2024-03-17 ENCOUNTER — Other Ambulatory Visit (HOSPITAL_BASED_OUTPATIENT_CLINIC_OR_DEPARTMENT_OTHER): Payer: Self-pay

## 2024-03-17 VITALS — BP 128/74 | HR 76 | Temp 98.9°F | Wt 159.2 lb

## 2024-03-17 DIAGNOSIS — B20 Human immunodeficiency virus [HIV] disease: Secondary | ICD-10-CM

## 2024-03-17 DIAGNOSIS — R220 Localized swelling, mass and lump, head: Secondary | ICD-10-CM | POA: Diagnosis not present

## 2024-03-17 DIAGNOSIS — N179 Acute kidney failure, unspecified: Secondary | ICD-10-CM | POA: Insufficient documentation

## 2024-03-17 DIAGNOSIS — Z79899 Other long term (current) drug therapy: Secondary | ICD-10-CM

## 2024-03-17 DIAGNOSIS — Z712 Person consulting for explanation of examination or test findings: Secondary | ICD-10-CM | POA: Insufficient documentation

## 2024-03-17 LAB — HIV RNA, RTPCR W/R GT (RTI, PI,INT)
HIV 1 RNA Quant: 142 {copies}/mL — ABNORMAL HIGH
HIV-1 RNA Quant, Log: 2.15 {Log_copies}/mL — ABNORMAL HIGH

## 2024-03-17 LAB — COMPREHENSIVE METABOLIC PANEL WITH GFR
AG Ratio: 1.6 (calc) (ref 1.0–2.5)
ALT: 12 U/L (ref 9–46)
AST: 17 U/L (ref 10–40)
Albumin: 4.9 g/dL (ref 3.6–5.1)
Alkaline phosphatase (APISO): 68 U/L (ref 36–130)
BUN/Creatinine Ratio: 6 (calc) (ref 6–22)
BUN: 8 mg/dL (ref 7–25)
CO2: 27 mmol/L (ref 20–32)
Calcium: 9.8 mg/dL (ref 8.6–10.3)
Chloride: 103 mmol/L (ref 98–110)
Creat: 1.37 mg/dL — ABNORMAL HIGH (ref 0.60–1.26)
Globulin: 3 g/dL (ref 1.9–3.7)
Glucose, Bld: 92 mg/dL (ref 65–99)
Potassium: 4 mmol/L (ref 3.5–5.3)
Sodium: 138 mmol/L (ref 135–146)
Total Bilirubin: 0.6 mg/dL (ref 0.2–1.2)
Total Protein: 7.9 g/dL (ref 6.1–8.1)
eGFR: 67 mL/min/1.73m2 (ref >=60)

## 2024-03-17 LAB — BASIC METABOLIC PANEL WITH GFR
BUN/Creatinine Ratio: 9 (calc) (ref 6–22)
BUN: 14 mg/dL (ref 7–25)
CO2: 26 mmol/L (ref 20–32)
Calcium: 9.3 mg/dL (ref 8.6–10.3)
Chloride: 104 mmol/L (ref 98–110)
Creat: 1.48 mg/dL — ABNORMAL HIGH (ref 0.60–1.26)
Glucose, Bld: 91 mg/dL (ref 65–99)
Potassium: 4.3 mmol/L (ref 3.5–5.3)
Sodium: 138 mmol/L (ref 135–146)
eGFR: 61 mL/min/1.73m2 (ref >=60)

## 2024-03-17 MED ORDER — MUPIROCIN CALCIUM 2 % EX CREA
1.0000 | TOPICAL_CREAM | Freq: Two times a day (BID) | CUTANEOUS | 0 refills | Status: DC
  Filled 2024-03-17: qty 15, 8d supply, fill #0

## 2024-03-17 MED ORDER — MUPIROCIN 2 % EX OINT
1.0000 | TOPICAL_OINTMENT | Freq: Two times a day (BID) | CUTANEOUS | 0 refills | Status: AC
  Filled 2024-03-17: qty 22, 11d supply, fill #0

## 2024-03-17 NOTE — Progress Notes (Signed)
 [address], [place], [place], [address]                                                                  Phn. [phone]; Fax: [phone]                                                                             Date: 03/17/24  Reason for Visit: HIV follow up  HPI: Jordan Benjamin is a 38 y.o.old male  ( MSM) with a history of HIV, Shingles ( treated), Monkey pox s/p tx who is here for a follow up.  7/18 He is here due to concerns of whitish lesion at the inner aspect of the left upper lip area where he had a cut and wanted to be looked at. He has been taking PO bactrim . He also has few small bumps in the philtrum area where he had shaving done .The inner lip burns when he eats. No other ulcers, sores. Mother had h/o kidney failure. He reports he was one of biktarvy  for a month approx a month ago. No other concerns.   ROS: Denies fevers, chills, sweats. Denies nausea, vomiting, abdominal pain and diarrhea. Denies chest pain, SOB and cough. Denies joint complaints. Denies headache, blurry vision etc   Current Outpatient Medications on File Prior to Visit  Medication Sig Dispense Refill   bictegravir-emtricitabine -tenofovir  AF (BIKTARVY ) 50-200-25 MG TABS tablet Take 1 tablet by mouth daily. 30 tablet 11   sulfamethoxazole -trimethoprim  (BACTRIM  DS) 800-160 MG tablet Take 1 tablet by mouth 2 (two) times daily for 3 days. 6 tablet 0   No current facility-administered medications on file prior to visit.   No Known Allergies  Social History   Socioeconomic History   Marital status: Single    Spouse name: Not on file   Number of children: Not on file   Years of education: Not on file   Highest education level: Not on file  Occupational History   Not on file  Tobacco Use   Smoking status:  Every Day   Smokeless tobacco: Former    Quit date: 10/01/2020   Tobacco comments:    1-2 cigarettes a day; quit February 2022  Vaping Use   Vaping status: Never Used  Substance and Sexual Activity   Alcohol use: Yes    Comment: very rarely    Drug use: Yes    Types: Marijuana    Comment: daily    Sexual activity: Not Currently    Partners: Male    Birth control/protection: Condom    Comment: declined condoms 03/2021  Other Topics Concern   Not on file  Social History Narrative   Not on file   Social Drivers of Health   Financial Resource Strain: Not on file  Food Insecurity: Not on file  Transportation Needs: Not on file  Physical Activity: Not on file  Stress: Not on file  Social Connections: Not on file  Intimate Partner Violence: Not on file   No family history on file.  Vitals  BP 128/74   Pulse 76   Temp 98.9 F (37.2 C) (Oral)   Wt 159 lb 3.2 oz (72.2 kg)   SpO2 98%   BMI 21.00 kg/m   Examination  Gen: Alert and oriented x 3, no acute distress HEENT: [place]/AT, no scleral icterus, no pale conjunctivae, hearing normal, oral mucosa moist,  no thrush Neck: Supple Cardio: Regular rate and rhythm Resp: Respiratory effort WNL GI: nondistended GU: Musc: Extremities: No pedal edema  Skin: Left upper lip cut, healing, whitish area in the inner aspect of the left upper lip nearby the cut. Does not appear to be herpetic. No vesicles. No sores or ulcers at other sites of oral cavity. Few bumps in the philtrum area  Neuro: grossly non focal  Psych: Calm, cooperative  Lab Results HIV 1 RNA Quant (copies/mL)  Date Value  03/14/2024 142 (H)  06/08/2023 NOT DETECTED  04/17/2022 <20 DETECTED (A)   CD4 T Cell Abs (/uL)  Date Value  03/14/2024 326 (L)  06/08/2023 295 (L)  04/23/2021 229 (L)   No results found for: HIV1GENOSEQ Lab Results  Component Value Date   WBC 4.4 06/08/2023   HGB 14.9 06/08/2023   HCT 45.6 06/08/2023   MCV 88.9 06/08/2023   PLT 258  06/08/2023    Lab Results  Component Value Date   CREATININE 1.37 (H) 03/14/2024   BUN 8 03/14/2024   NA 138 03/14/2024   K 4.0 03/14/2024   CL 103 03/14/2024   CO2 27 03/14/2024   Lab Results  Component Value Date   ALT 12 03/14/2024   AST 17 03/14/2024   BILITOT 0.6 03/14/2024    Lab Results  Component Value Date   CHOL 128 06/08/2023   TRIG 82 06/08/2023   HDL 69 06/08/2023   LDLCALC 43 06/08/2023   Lab Results  Component Value Date   HAV NON-REACTIVE 04/26/2020   Lab Results  Component Value Date   HEPBSAG NON-REACTIVE 04/26/2020   HEPBSAB REACTIVE (A) 04/26/2020   No results found for: HCVAB Lab Results  Component Value Date   CHLAMYDIAWP Negative 06/08/2023   CHLAMYDIAWP Negative 06/08/2023   N Negative 06/08/2023   N Negative 06/08/2023   No results found for: GCPROBEAPT No results found for: QUANTGOLD   Health Maintenance: Immunization History  Administered Date(s) Administered   HPV 9-valent 03/14/2024   Hepatitis A, Adult 08/16/2020, 04/23/2021   Influenza-Unspecified 04/01/2023   PNEUMOCOCCAL CONJUGATE-20 04/17/2022   Pfizer(Comirnaty)Fall Seasonal Vaccine 12 years and older 06/01/2023, 06/01/2023   Pneumococcal Conjugate-13 08/16/2020   Rabies, IM 05/19/2018, 05/23/2018   Tdap 05/19/2018    Assessment/Plan: # HIV in a MSM - 7/15 labs reviewed and discussed. He was out of biktarvy  for a month approx a month ago and hence likely elevated from prior values. Encouraged compliance  - fu in 3 months   # Left upper lip swelling/injury - completing course of PO bactrim  today. Does not appear to be Herpetic - mupirocin  ointment BID  # AKI - likely 2/2 dehydration, he is completing bactrim  today - discussed hydration - BMP today   I  spent 20 minutes for face to face as well as non-face-to-face activities during the day of encounter including review of prior notes, review of labs, medically appropriate examination and evaluation, ordering  labs and medications, documenting notes in the EMR, counseling of the patient and coordination of care.    Patient's labs were reviewed as well as his previous records. Patients questions were addressed and answered. Safe sex counseling done.   Electronically signed by:  Annalee Orem, MD Infectious Disease Physician [facility] for Infectious Disease [address] [place], [place] [address] Phone: [phone]  Fax: [phone]

## 2024-03-19 ENCOUNTER — Ambulatory Visit: Payer: Self-pay | Admitting: Infectious Diseases

## 2024-03-19 ENCOUNTER — Other Ambulatory Visit: Payer: Self-pay | Admitting: Infectious Diseases

## 2024-03-19 DIAGNOSIS — N179 Acute kidney failure, unspecified: Secondary | ICD-10-CM

## 2024-03-21 ENCOUNTER — Other Ambulatory Visit: Payer: Self-pay

## 2024-03-27 ENCOUNTER — Other Ambulatory Visit (HOSPITAL_COMMUNITY): Payer: Self-pay

## 2024-06-23 ENCOUNTER — Ambulatory Visit: Payer: PRIVATE HEALTH INSURANCE | Admitting: Infectious Diseases

## 2024-08-15 ENCOUNTER — Ambulatory Visit: Payer: PRIVATE HEALTH INSURANCE | Admitting: Infectious Diseases

## 2024-08-21 NOTE — Progress Notes (Signed)
 The ASCVD Risk score (Arnett DK, et al., 2019) failed to calculate for the following reasons:   The valid total cholesterol range is 130 to 320 mg/dL  Arlon Bergamo, BSN, RN
